# Patient Record
Sex: Female | Born: 1960 | Race: White | Hispanic: No | Marital: Married | Smoking: Current every day smoker
Health system: Southern US, Community
[De-identification: ages and names within clinical notes are randomized; demographics above are authoritative.]

## PROBLEM LIST (undated history)

## (undated) HISTORY — PX: BACK SURGERY: SHX140

## (undated) HISTORY — PX: NECK SURGERY: SHX720

## (undated) HISTORY — PX: CARDIAC CATHETERIZATION: SHX172

## (undated) HISTORY — PX: TUBAL LIGATION: SHX77

---

## 2003-09-02 ENCOUNTER — Ambulatory Visit (HOSPITAL_COMMUNITY): Admission: RE | Admit: 2003-09-02 | Discharge: 2003-09-02 | Payer: Self-pay | Admitting: Neurosurgery

## 2003-09-10 ENCOUNTER — Ambulatory Visit (HOSPITAL_COMMUNITY): Admission: RE | Admit: 2003-09-10 | Discharge: 2003-09-11 | Payer: Self-pay | Admitting: Neurosurgery

## 2005-01-06 ENCOUNTER — Other Ambulatory Visit: Payer: Self-pay

## 2005-01-06 ENCOUNTER — Inpatient Hospital Stay: Payer: Self-pay | Admitting: Internal Medicine

## 2005-01-07 ENCOUNTER — Other Ambulatory Visit: Payer: Self-pay

## 2005-01-08 ENCOUNTER — Other Ambulatory Visit: Payer: Self-pay

## 2010-09-28 DIAGNOSIS — I2510 Atherosclerotic heart disease of native coronary artery without angina pectoris: Secondary | ICD-10-CM | POA: Insufficient documentation

## 2010-09-28 DIAGNOSIS — E785 Hyperlipidemia, unspecified: Secondary | ICD-10-CM | POA: Insufficient documentation

## 2013-02-27 ENCOUNTER — Emergency Department: Payer: Self-pay | Admitting: Emergency Medicine

## 2013-02-27 LAB — RAPID INFLUENZA A&B ANTIGENS

## 2013-04-22 DIAGNOSIS — IMO0001 Reserved for inherently not codable concepts without codable children: Secondary | ICD-10-CM | POA: Insufficient documentation

## 2013-04-22 DIAGNOSIS — F17200 Nicotine dependence, unspecified, uncomplicated: Secondary | ICD-10-CM | POA: Insufficient documentation

## 2014-10-04 DIAGNOSIS — M4302 Spondylolysis, cervical region: Secondary | ICD-10-CM | POA: Insufficient documentation

## 2014-10-12 DIAGNOSIS — M5412 Radiculopathy, cervical region: Secondary | ICD-10-CM | POA: Insufficient documentation

## 2018-09-20 ENCOUNTER — Emergency Department
Admission: EM | Admit: 2018-09-20 | Discharge: 2018-09-20 | Disposition: A | Discharge status: Home / Self Care | Payer: 59 | Attending: Emergency Medicine | Admitting: Emergency Medicine

## 2018-09-20 ENCOUNTER — Other Ambulatory Visit: Payer: Self-pay

## 2018-09-20 ENCOUNTER — Encounter: Payer: Self-pay | Admitting: Emergency Medicine

## 2018-09-20 ENCOUNTER — Emergency Department: Payer: 59

## 2018-09-20 DIAGNOSIS — I2510 Atherosclerotic heart disease of native coronary artery without angina pectoris: Secondary | ICD-10-CM | POA: Insufficient documentation

## 2018-09-20 DIAGNOSIS — G8929 Other chronic pain: Secondary | ICD-10-CM | POA: Insufficient documentation

## 2018-09-20 DIAGNOSIS — E785 Hyperlipidemia, unspecified: Secondary | ICD-10-CM | POA: Insufficient documentation

## 2018-09-20 DIAGNOSIS — R519 Headache, unspecified: Secondary | ICD-10-CM

## 2018-09-20 DIAGNOSIS — M542 Cervicalgia: Secondary | ICD-10-CM | POA: Insufficient documentation

## 2018-09-20 DIAGNOSIS — R51 Headache: Secondary | ICD-10-CM | POA: Insufficient documentation

## 2018-09-20 DIAGNOSIS — F17210 Nicotine dependence, cigarettes, uncomplicated: Secondary | ICD-10-CM | POA: Insufficient documentation

## 2018-09-20 DIAGNOSIS — R4182 Altered mental status, unspecified: Secondary | ICD-10-CM | POA: Diagnosis not present

## 2018-09-20 LAB — COMPREHENSIVE METABOLIC PANEL
ALT: 17 U/L (ref 0–44)
AST: 43 U/L — ABNORMAL HIGH (ref 15–41)
Albumin: 3.9 g/dL (ref 3.5–5.0)
Alkaline Phosphatase: 62 U/L (ref 38–126)
Anion gap: 11 (ref 5–15)
BUN: 9 mg/dL (ref 6–20)
CO2: 22 mmol/L (ref 22–32)
Calcium: 8.4 mg/dL — ABNORMAL LOW (ref 8.9–10.3)
Chloride: 106 mmol/L (ref 98–111)
Creatinine, Ser: 0.77 mg/dL (ref 0.44–1.00)
GFR calc Af Amer: >60 mL/min (ref >60)
GFR calc non Af Amer: >60 mL/min (ref >60)
Glucose, Bld: 95 mg/dL (ref 70–99)
Potassium: 3.4 mmol/L — ABNORMAL LOW (ref 3.5–5.1)
Sodium: 139 mmol/L (ref 135–145)
Total Bilirubin: 0.7 mg/dL (ref 0.3–1.2)
Total Protein: 6.8 g/dL (ref 6.5–8.1)

## 2018-09-20 LAB — URINALYSIS, ROUTINE W REFLEX MICROSCOPIC
Bilirubin Urine: NEGATIVE
Glucose, UA: NEGATIVE mg/dL
Ketones, ur: 20 mg/dL — AB
Leukocytes,Ua: NEGATIVE
Nitrite: NEGATIVE
Protein, ur: NEGATIVE mg/dL
Specific Gravity, Urine: 1.003 — ABNORMAL LOW (ref 1.005–1.030)
Squamous Epithelial / LPF: NONE SEEN (ref 0–5)
pH: 5 (ref 5.0–8.0)

## 2018-09-20 LAB — CBC
HCT: 40.1 % (ref 36.0–46.0)
Hemoglobin: 13.3 g/dL (ref 12.0–15.0)
MCH: 32.2 pg (ref 26.0–34.0)
MCHC: 33.2 g/dL (ref 30.0–36.0)
MCV: 97.1 fL (ref 80.0–100.0)
Platelets: 229 10*3/uL (ref 150–400)
RBC: 4.13 MIL/uL (ref 3.87–5.11)
RDW: 15.3 % (ref 11.5–15.5)
WBC: 5.9 10*3/uL (ref 4.0–10.5)
nRBC: 1.2 % — ABNORMAL HIGH (ref 0.0–0.2)

## 2018-09-20 LAB — GLUCOSE, CAPILLARY: Glucose-Capillary: 96 mg/dL (ref 70–99)

## 2018-09-20 NOTE — ED Provider Notes (Signed)
[facility] Emergency Department Provider Note  ____________________________________________   First MD Initiated Contact with Patient 09/20/18 2015     (approximate)  I have reviewed the triage vital signs and the nursing notes.   HISTORY  Chief Complaint Headache and Altered Mental Status    HPI Annette Huff is a 58 y.o. female who presents with concerns for memory issues and twitching in her hands.  Patient has had symptoms since Tuesday.  She said her symptoms started off as feeling like it was hard to hear out of her bilateral ears like her head was in a bucket.  She describes some mild frontal headache.  She takes [facility] powder with some relief.  Nothing seems to make it worse.  She denies it being a sudden onset severe headache.  Denies any changes in her vision.  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  She also endorsed having some shaking in her bilateral hands however that has mostly resolved now.  Denies any fevers or neck stiffness.       Medical: Hyperlipidemia, coronary disease.  Patient Active Problem List   Diagnosis Date Noted  . Chronic neck pain 09/20/2018  . Cervical radiculopathy 10/12/2014  . 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 Smoking 04/22/2013  . Coronary artery disease involving native coronary artery of native heart without angina pectoris 09/28/2010  . Hyperlipidemia 09/28/2010      Prior to Admission medications   Not on File    Allergies Guaifenesin and Sulfa antibiotics  No family history on file.  Social History Social History   Tobacco Use  . Smoking status: Current Every Day Smoker    Packs/day: 1.00    Types: Cigarettes  . Smokeless tobacco: Never Used  Substance Use Topics  . Alcohol use: Not on file  . Drug use: Not on file      Review of Systems Constitutional: No fever/chills Eyes: No  visual changes. ENT: No sore throat.  Positive for decreased hearing bilaterally Cardiovascular: Denies chest pain. Respiratory: Denies shortness of breath. Gastrointestinal: No abdominal pain.  No nausea, no vomiting.  No diarrhea.  No constipation. Genitourinary: Negative for dysuria. Musculoskeletal: Negative for back pain.  Positive for twitching in her bilateral hands Skin: Negative for rash. Neurological: Positive for headaches All other ROS negative ____________________________________________   PHYSICAL EXAM:  VITAL SIGNS: ED Triage Vitals  Enc Vitals Group     BP 09/20/18 1831 116/61     Pulse Rate 09/20/18 1831 85     Resp 09/20/18 1831 18     Temp 09/20/18 1831 98.8 F (37.1 C)     Temp Source 09/20/18 1831 Oral     SpO2 09/20/18 1831 96 %     Weight 09/20/18 1826 155 lb (70.3 kg)     Height 09/20/18 1826 5\' 5"  (1.651 m)     Head Circumference --      Peak Flow --      Pain Score 09/20/18 1826 3     Pain Loc --      Pain Edu? --      Excl. in [place]? --     Constitutional: Alert and oriented. Well appearing and in no acute distress. Eyes: Conjunctivae are normal. EOMI. Head: Atraumatic.TM clear bilaterally Nose: No congestion/rhinnorhea.   Mouth/Throat: Mucous membranes are moist.   Neck: No stridor. Trachea Midline. FROM Cardiovascular: Normal rate,  regular rhythm. Grossly normal heart sounds.  Good peripheral circulation. Respiratory: Normal respiratory effort.  No retractions. Lungs CTAB. Gastrointestinal: Soft and nontender. No distention. No abdominal bruits.  Musculoskeletal: No lower extremity tenderness nor edema.  No joint effusions.  No twitching in her hands noted. Neurologic:  Normal speech and language.  Alert and oriented x3.  Cranial nerves II through XII intact.  No pronator drift.  Able to ambulate. Skin:  Skin is warm, dry and intact. No rash noted. Psychiatric: Mood and affect are normal. Speech and behavior are normal. GU: Deferred    ____________________________________________   LABS (all labs ordered are listed, but only abnormal results are displayed)  Labs Reviewed  COMPREHENSIVE METABOLIC PANEL - Abnormal; Notable for the following components:      Result Value   Potassium 3.4 (*)    Calcium 8.4 (*)    AST 43 (*)    All other components within normal limits  CBC - Abnormal; Notable for the following components:   nRBC 1.2 (*)    All other components within normal limits  URINALYSIS, ROUTINE W REFLEX MICROSCOPIC - Abnormal; Notable for the following components:   Color, Urine STRAW (*)    APPearance CLEAR (*)    Specific Gravity, Urine 1.003 (*)    Hgb urine dipstick SMALL (*)    Ketones, ur 20 (*)    Bacteria, UA RARE (*)    All other components within normal limits  GLUCOSE, CAPILLARY  CBG MONITORING, ED   ____________________________________________   ED ECG REPORT =______________________________  RADIOLOGY   Official radiology report(s): Ct Head Wo Contrast  Result Date: 09/20/2018 CLINICAL DATA:  Headache and decreased hearing. Memory loss over the past few days. EXAM: CT HEAD WITHOUT CONTRAST TECHNIQUE: Contiguous axial images were obtained from the base of the skull through the vertex without intravenous contrast. COMPARISON:  None. FINDINGS: Brain: No evidence of acute infarction, hemorrhage, hydrocephalus, extra-axial collection or mass lesion/mass effect. Vascular: No hyperdense vessel or unexpected calcification. Skull: Normal. Negative for fracture or focal lesion. Sinuses/Orbits: No acute finding. Other: None. IMPRESSION: Normal head CT. Electronically Signed   By: Elberta Fortisaniel  Boyle M.D.   On: 09/20/2018 21:45    ____________________________________________   PROCEDURES  Procedure(s) performed (including Critical Care):  Procedures   ____________________________________________   INITIAL IMPRESSION / ASSESSMENT AND PLAN / ED COURSE  [name] was evaluated in Emergency  Department on 09/20/2018 for the symptoms described in the history of present illness. She was evaluated in the context of the global COVID-19 pandemic, which necessitated consideration that the patient might be at risk for infection with the SARS-CoV-2 virus that causes COVID-19. Institutional protocols and algorithms that pertain to the evaluation of patients at risk for COVID-19 are in a state of rapid change based on information released by regulatory bodies including the CDC and federal and state organizations. These policies and algorithms were followed during the patient's care in the ED.    Patient is well-appearing with a normal neuro exam.  Will get CT head to evaluate for intracranial mass.  Low suspicion for stroke given her normal cranial nerves exam.  TMs are clear therefore low suspicion for ear infections.  Low suspicion for meningitis given patient is afebrile with supple neck.  Patient does not have any twitching in her hands at this time. Not the worst headache of life to suggest SAH. No vision changes to suggest venous thrombus.   Pt declined medications for her headache.    Glucose  is normal. Labs notable for a K of 3.4 White count normal. Ct head negative.  10:17 PM reevaluated patient continues to look well.  Patient was discharged.  Patient struck to follow-up with her primary care doctor.  Symptoms are not resolving patient may need MRI for further work-up.  Again low suspicion for stroke at this time.  I discussed the provisional nature of ED diagnosis, the treatment so far, the ongoing plan of care, follow up appointments and return precautions with the patient and any family or support people present. They expressed understanding and agreed with the plan, discharged home.      Blood pressure 112/64, pulse 63, temperature 98.8 F (37.1 C), temperature source Oral, resp. rate 18, height 5\' 5"  (1.651 m), weight 70.3 kg, SpO2 97 %.       ____________________________________________   FINAL CLINICAL IMPRESSION(S) / ED DIAGNOSES   Final diagnoses:  Intractable headache, unspecified chronicity pattern, unspecified headache type      MEDICATIONS GIVEN DURING THIS VISIT:  Medications - No data to display   ED Discharge Orders    None       Note:  This document was prepared using Dragon voice recognition software and may include unintentional dictation errors.   Concha SeFunke, Brentin Shin E, MD 09/20/18 2219

## 2018-09-20 NOTE — ED Notes (Signed)
First Nurse Note: Pt to ED via POV c/o memory issues and "twitching" in her hands. Pt states that she has had symptoms since Tuesday. Pt is in NAD.

## 2018-09-20 NOTE — ED Notes (Signed)
Patient given TV remote 

## 2018-09-20 NOTE — Discharge Instructions (Addendum)
Your CT scan was negative and your labs were reassuring.  You should follow-up with your primary care doctor as needed.  Return to the ER for any other concerns.  To note your AST is slightly elevated at 43 this is a liver enzyme.  This is nothing to do with your headaches.  F/u with PCP.

## 2018-09-20 NOTE — ED Triage Notes (Signed)
Pt arrived via POV with reports of "not feeling good" Pt reports headache and decreased hearing. Pt reports frontal headache and states she woke up Tuesday and heard a humming noise in her ears.  Pt states she also has had some memory loss over the past few days as well. States she couldn't remember how to do some simple tasks at work that she normally does.

## 2018-10-01 ENCOUNTER — Other Ambulatory Visit
Admission: RE | Admit: 2018-10-01 | Discharge: 2018-10-01 | Disposition: A | Discharge status: Home / Self Care | Payer: 59 | Source: Ambulatory Visit | Attending: Internal Medicine | Admitting: Internal Medicine

## 2018-10-01 DIAGNOSIS — J449 Chronic obstructive pulmonary disease, unspecified: Secondary | ICD-10-CM | POA: Insufficient documentation

## 2018-10-01 DIAGNOSIS — E118 Type 2 diabetes mellitus with unspecified complications: Secondary | ICD-10-CM | POA: Diagnosis present

## 2018-10-01 DIAGNOSIS — I2510 Atherosclerotic heart disease of native coronary artery without angina pectoris: Secondary | ICD-10-CM | POA: Diagnosis present

## 2018-10-01 LAB — AMMONIA: Ammonia: 26 umol/L (ref 9–35)

## 2018-10-16 ENCOUNTER — Other Ambulatory Visit: Payer: Self-pay | Admitting: Internal Medicine

## 2018-10-16 DIAGNOSIS — R270 Ataxia, unspecified: Secondary | ICD-10-CM

## 2018-10-16 DIAGNOSIS — R410 Disorientation, unspecified: Secondary | ICD-10-CM

## 2018-10-16 DIAGNOSIS — G8929 Other chronic pain: Secondary | ICD-10-CM

## 2018-10-26 ENCOUNTER — Other Ambulatory Visit: Payer: Self-pay

## 2018-10-26 ENCOUNTER — Ambulatory Visit
Admission: RE | Admit: 2018-10-26 | Discharge: 2018-10-26 | Disposition: A | Discharge status: Home / Self Care | Payer: 59 | Source: Ambulatory Visit | Attending: Internal Medicine | Admitting: Internal Medicine

## 2018-10-26 DIAGNOSIS — G8929 Other chronic pain: Secondary | ICD-10-CM

## 2018-10-26 DIAGNOSIS — R270 Ataxia, unspecified: Secondary | ICD-10-CM | POA: Insufficient documentation

## 2018-10-26 DIAGNOSIS — R410 Disorientation, unspecified: Secondary | ICD-10-CM | POA: Insufficient documentation

## 2018-10-26 DIAGNOSIS — R51 Headache: Secondary | ICD-10-CM | POA: Diagnosis not present

## 2018-10-26 MED ORDER — GADOBUTROL 1 MMOL/ML IV SOLN
7.0000 mL | Freq: Once | INTRAVENOUS | Status: AC | PRN
  Administered 2018-10-26: 7 mL via INTRAVENOUS

## 2019-08-11 DIAGNOSIS — G3184 Mild cognitive impairment, so stated: Secondary | ICD-10-CM | POA: Diagnosis present

## 2019-09-15 ENCOUNTER — Inpatient Hospital Stay
Admission: EM | Admit: 2019-09-15 | Discharge: 2019-09-20 | DRG: 481 | Disposition: A | Discharge status: Home Health | Payer: 59 | Attending: Internal Medicine | Admitting: Internal Medicine

## 2019-09-15 ENCOUNTER — Emergency Department: Payer: 59

## 2019-09-15 ENCOUNTER — Other Ambulatory Visit: Payer: Self-pay

## 2019-09-15 DIAGNOSIS — E785 Hyperlipidemia, unspecified: Secondary | ICD-10-CM | POA: Diagnosis present

## 2019-09-15 DIAGNOSIS — R0902 Hypoxemia: Secondary | ICD-10-CM | POA: Diagnosis not present

## 2019-09-15 DIAGNOSIS — Z882 Allergy status to sulfonamides status: Secondary | ICD-10-CM

## 2019-09-15 DIAGNOSIS — I959 Hypotension, unspecified: Secondary | ICD-10-CM | POA: Diagnosis not present

## 2019-09-15 DIAGNOSIS — F17210 Nicotine dependence, cigarettes, uncomplicated: Secondary | ICD-10-CM | POA: Diagnosis present

## 2019-09-15 DIAGNOSIS — E118 Type 2 diabetes mellitus with unspecified complications: Secondary | ICD-10-CM | POA: Diagnosis not present

## 2019-09-15 DIAGNOSIS — F329 Major depressive disorder, single episode, unspecified: Secondary | ICD-10-CM | POA: Diagnosis present

## 2019-09-15 DIAGNOSIS — S32020A Wedge compression fracture of second lumbar vertebra, initial encounter for closed fracture: Secondary | ICD-10-CM

## 2019-09-15 DIAGNOSIS — G3184 Mild cognitive impairment, so stated: Secondary | ICD-10-CM | POA: Diagnosis present

## 2019-09-15 DIAGNOSIS — Z01818 Encounter for other preprocedural examination: Secondary | ICD-10-CM

## 2019-09-15 DIAGNOSIS — Z79899 Other long term (current) drug therapy: Secondary | ICD-10-CM | POA: Diagnosis not present

## 2019-09-15 DIAGNOSIS — E871 Hypo-osmolality and hyponatremia: Secondary | ICD-10-CM | POA: Diagnosis present

## 2019-09-15 DIAGNOSIS — J9621 Acute and chronic respiratory failure with hypoxia: Secondary | ICD-10-CM | POA: Diagnosis not present

## 2019-09-15 DIAGNOSIS — W1830XA Fall on same level, unspecified, initial encounter: Secondary | ICD-10-CM | POA: Diagnosis present

## 2019-09-15 DIAGNOSIS — Z955 Presence of coronary angioplasty implant and graft: Secondary | ICD-10-CM

## 2019-09-15 DIAGNOSIS — Y92019 Unspecified place in single-family (private) house as the place of occurrence of the external cause: Secondary | ICD-10-CM | POA: Diagnosis not present

## 2019-09-15 DIAGNOSIS — S72009A Fracture of unspecified part of neck of unspecified femur, initial encounter for closed fracture: Secondary | ICD-10-CM

## 2019-09-15 DIAGNOSIS — E119 Type 2 diabetes mellitus without complications: Secondary | ICD-10-CM | POA: Diagnosis present

## 2019-09-15 DIAGNOSIS — Z8673 Personal history of transient ischemic attack (TIA), and cerebral infarction without residual deficits: Secondary | ICD-10-CM

## 2019-09-15 DIAGNOSIS — Z20822 Contact with and (suspected) exposure to covid-19: Secondary | ICD-10-CM | POA: Diagnosis present

## 2019-09-15 DIAGNOSIS — S72142A Displaced intertrochanteric fracture of left femur, initial encounter for closed fracture: Secondary | ICD-10-CM | POA: Diagnosis present

## 2019-09-15 DIAGNOSIS — M25552 Pain in left hip: Secondary | ICD-10-CM | POA: Diagnosis present

## 2019-09-15 DIAGNOSIS — R5082 Postprocedural fever: Secondary | ICD-10-CM | POA: Diagnosis not present

## 2019-09-15 DIAGNOSIS — Y92009 Unspecified place in unspecified non-institutional (private) residence as the place of occurrence of the external cause: Secondary | ICD-10-CM

## 2019-09-15 DIAGNOSIS — Z888 Allergy status to other drugs, medicaments and biological substances status: Secondary | ICD-10-CM

## 2019-09-15 DIAGNOSIS — Z419 Encounter for procedure for purposes other than remedying health state, unspecified: Secondary | ICD-10-CM

## 2019-09-15 DIAGNOSIS — I10 Essential (primary) hypertension: Secondary | ICD-10-CM | POA: Diagnosis present

## 2019-09-15 DIAGNOSIS — M4856XD Collapsed vertebra, not elsewhere classified, lumbar region, subsequent encounter for fracture with routine healing: Secondary | ICD-10-CM | POA: Diagnosis present

## 2019-09-15 DIAGNOSIS — R4189 Other symptoms and signs involving cognitive functions and awareness: Secondary | ICD-10-CM | POA: Diagnosis not present

## 2019-09-15 DIAGNOSIS — I2510 Atherosclerotic heart disease of native coronary artery without angina pectoris: Secondary | ICD-10-CM | POA: Diagnosis present

## 2019-09-15 DIAGNOSIS — W19XXXA Unspecified fall, initial encounter: Secondary | ICD-10-CM

## 2019-09-15 DIAGNOSIS — S72145A Nondisplaced intertrochanteric fracture of left femur, initial encounter for closed fracture: Principal | ICD-10-CM | POA: Diagnosis present

## 2019-09-15 LAB — COMPREHENSIVE METABOLIC PANEL
ALT: 17 U/L (ref 0–44)
AST: 22 U/L (ref 15–41)
Albumin: 3.5 g/dL (ref 3.5–5.0)
Alkaline Phosphatase: 65 U/L (ref 38–126)
Anion gap: 12 (ref 5–15)
BUN: <5 mg/dL — ABNORMAL LOW (ref 8–23)
CO2: 23 mmol/L (ref 22–32)
Calcium: 8.1 mg/dL — ABNORMAL LOW (ref 8.9–10.3)
Chloride: 91 mmol/L — ABNORMAL LOW (ref 98–111)
Creatinine, Ser: 0.4 mg/dL — ABNORMAL LOW (ref 0.44–1.00)
GFR calc Af Amer: >60 mL/min (ref >60)
GFR calc non Af Amer: >60 mL/min (ref >60)
Glucose, Bld: 135 mg/dL — ABNORMAL HIGH (ref 70–99)
Potassium: 3.6 mmol/L (ref 3.5–5.1)
Sodium: 126 mmol/L — ABNORMAL LOW (ref 135–145)
Total Bilirubin: 0.5 mg/dL (ref 0.3–1.2)
Total Protein: 6.6 g/dL (ref 6.5–8.1)

## 2019-09-15 LAB — CBC WITH DIFFERENTIAL/PLATELET
Abs Immature Granulocytes: 0.05 10*3/uL (ref 0.00–0.07)
Basophils Absolute: 0 10*3/uL (ref 0.0–0.1)
Basophils Relative: 0 %
Eosinophils Absolute: 0 10*3/uL (ref 0.0–0.5)
Eosinophils Relative: 0 %
HCT: 37.1 % (ref 36.0–46.0)
Hemoglobin: 13.3 g/dL (ref 12.0–15.0)
Immature Granulocytes: 1 %
Lymphocytes Relative: 8 %
Lymphs Abs: 0.8 10*3/uL (ref 0.7–4.0)
MCH: 33.1 pg (ref 26.0–34.0)
MCHC: 35.8 g/dL (ref 30.0–36.0)
MCV: 92.3 fL (ref 80.0–100.0)
Monocytes Absolute: 0.7 10*3/uL (ref 0.1–1.0)
Monocytes Relative: 7 %
Neutro Abs: 8.1 10*3/uL — ABNORMAL HIGH (ref 1.7–7.7)
Neutrophils Relative %: 84 %
Platelets: 224 10*3/uL (ref 150–400)
RBC: 4.02 MIL/uL (ref 3.87–5.11)
RDW: 12.1 % (ref 11.5–15.5)
WBC: 9.7 10*3/uL (ref 4.0–10.5)
nRBC: 0 % (ref 0.0–0.2)

## 2019-09-15 LAB — SARS CORONAVIRUS 2 BY RT PCR (HOSPITAL ORDER, PERFORMED IN ~~LOC~~ HOSPITAL LAB): SARS Coronavirus 2: NEGATIVE

## 2019-09-15 LAB — OSMOLALITY: Osmolality: 259 mOsm/kg — ABNORMAL LOW (ref 275–295)

## 2019-09-15 MED ORDER — SENNOSIDES-DOCUSATE SODIUM 8.6-50 MG PO TABS: 1.0000 | ORAL_TABLET | Freq: Every evening | ORAL | Status: DC | PRN

## 2019-09-15 MED ORDER — MORPHINE SULFATE (PF) 2 MG/ML IV SOLN
0.5000 mg | INTRAVENOUS | Status: DC | PRN
  Filled 2019-09-15: qty 1

## 2019-09-15 MED ORDER — SODIUM CHLORIDE 0.9 % IV SOLN: INTRAVENOUS | Status: DC

## 2019-09-15 MED ORDER — INSULIN ASPART 100 UNIT/ML ~~LOC~~ SOLN
0.0000 [IU] | SUBCUTANEOUS | Status: DC
  Administered 2019-09-16: 1 [IU] via SUBCUTANEOUS
  Filled 2019-09-15: qty 1

## 2019-09-15 MED ORDER — HYDROCODONE-ACETAMINOPHEN 5-325 MG PO TABS
1.0000 | ORAL_TABLET | Freq: Four times a day (QID) | ORAL | Status: DC | PRN
  Administered 2019-09-15 – 2019-09-17 (×4): 2 via ORAL
  Administered 2019-09-19 (×2): 1 via ORAL
  Filled 2019-09-15 (×3): qty 2
  Filled 2019-09-15 (×2): qty 1
  Filled 2019-09-15: qty 2

## 2019-09-15 MED ORDER — CEFAZOLIN SODIUM-DEXTROSE 2-4 GM/100ML-% IV SOLN
2.0000 g | INTRAVENOUS | Status: AC
  Administered 2019-09-16: 2 g via INTRAVENOUS
  Filled 2019-09-15: qty 100

## 2019-09-15 MED ORDER — ZOLPIDEM TARTRATE 5 MG PO TABS
5.0000 mg | ORAL_TABLET | Freq: Every evening | ORAL | Status: DC | PRN
  Administered 2019-09-17: 5 mg via ORAL
  Filled 2019-09-15: qty 1

## 2019-09-15 NOTE — ED Provider Notes (Signed)
[facility] Emergency Department Provider Note  ____________________________________________  Time seen: Approximately 6:04 PM  I have reviewed the triage vital signs and the nursing notes.   HISTORY  Chief Complaint Fall    HPI Annette Huff is a 59 y.o. female who presents the emergency department via EMS from home after sustaining a fall last night.  Patient states that she was up past 2 AM watching TV.  At some point she got up, was unsure whether she was walking to bed, to the bathroom but did sustain a fall.  Evidently a family member heard her fall and her husband had to pick her out of the floor and carrier to the couch.  Patient does not remember the fall or events immediately following this injury.  Patient has been unable to stand or bear weight on the left lower extremity.  She has sharp left hip/pelvic pain.  Patient is unsure whether she hit her head, she is unsure whether she lost consciousness.  According to the husband the patient was very confused following the fall.  Patient denies any headache, visual changes, neck pain, chest pain, shortness of breath, abdominal pain.  No urinary or GI complaints.  Primary complaint at this time is left hip pain and inability to walk/bear weight.  No other complaints at this time.        History reviewed. No pertinent past medical history.  Patient Active Problem List   Diagnosis Date Noted   Fall at home, initial encounter 09/15/2019   Preoperative clearance 09/15/2019   Compression fracture of L2 [facility]) - age indeterminate 09/15/2019   Closed fracture of femur, intertrochanteric, left, initial encounter (HCC) 09/15/2019   History of TIA (transient ischemic attack) 09/15/2019   HTN (hypertension) 09/15/2019   Hyponatremia 09/15/2019   MCI (mild cognitive impairment) 08/11/2019   Diabetes mellitus type 2 with complications (HCC) 10/01/2018   Chronic neck pain 09/20/2018   Cervical  radiculopathy 10/12/2014   Spondylolysis of cervical region 10/04/2014   Smoking 04/22/2013   Coronary artery disease involving native coronary artery of native heart without angina pectoris 09/28/2010   Hyperlipidemia 09/28/2010    Past Surgical History:  Procedure Laterality Date   TUBAL LIGATION      Prior to Admission medications   Medication Sig Start Date End Date Taking? Authorizing Provider  acetaminophen (TYLENOL) 500 MG tablet Take 500 mg by mouth every 6 (six) hours as needed for mild pain.   Yes [provider]  escitalopram (LEXAPRO) 10 MG tablet Take 10 mg by mouth daily at 6 (six) AM. 07/17/19 10/15/19 Yes [provider]    Allergies Guaifenesin and Sulfa antibiotics  History reviewed. No pertinent family history.  Social History Social History   Tobacco Use   Smoking status: Current Every Day Smoker    Packs/day: 1.00    Types: Cigarettes   Smokeless tobacco: Never Used  Vaping Use   Vaping Use: Never used  Substance Use Topics   Alcohol use: Not on file   Drug use: Not on file     Review of Systems  Constitutional: No fever/chills Eyes: No visual changes. No discharge ENT: No upper respiratory complaints. Cardiovascular: no chest pain. Respiratory: no cough. No SOB. Gastrointestinal: No abdominal pain.  No nausea, no vomiting.  No diarrhea.  No constipation. Genitourinary: Negative for dysuria. No hematuria Musculoskeletal: Left hip/pelvis pain with inability to bear weight on the left lower extremity. Skin: Negative for rash, abrasions, lacerations, ecchymosis. Neurological: Negative for  headaches, focal weakness or numbness. 10-point ROS otherwise negative.  ____________________________________________   PHYSICAL EXAM:  VITAL SIGNS: ED Triage Vitals  Enc Vitals Group     BP 09/15/19 1757 125/89     Pulse Rate 09/15/19 1757 80     Resp 09/15/19 1757 16     Temp 09/15/19 1757 97.9 F (36.6 C)     Temp Source  09/15/19 1757 Oral     SpO2 09/15/19 1757 95 %     Weight 09/15/19 1801 133 lb (60.3 kg)     Height 09/15/19 1801 5\' 5"  (1.651 m)     Head Circumference --      Peak Flow --      Pain Score 09/15/19 1800 6     Pain Loc --      Pain Edu? --      Excl. in GC? --      Constitutional: Alert and oriented. Well appearing and in no acute distress. Eyes: Conjunctivae are normal. PERRL. EOMI. Head: Atraumatic.  No abrasions, lacerations, ecchymosis, hematomas.  No tenderness to palpation over the osseous structures of the skull and face.  No palpable abnormality or crepitus.  No battle signs, raccoon eyes, serosanguineous fluid drainage from the ears or nares. ENT:      Ears:       Nose: No congestion/rhinnorhea.      Mouth/Throat: Mucous membranes are moist.  Neck: No stridor.  No cervical spine tenderness to palpation.  Cardiovascular: Normal rate, regular rhythm. Normal S1 and S2.  Good peripheral circulation. Respiratory: Normal respiratory effort without tachypnea or retractions. Lungs CTAB. Good air entry to the bases with no decreased or absent breath sounds. Gastrointestinal: Bowel sounds 4 quadrants. Soft and nontender to palpation. No guarding or rigidity. No palpable masses. No distention.  Musculoskeletal: Full range of motion to all extremities. No gross deformities appreciated.  Visualization of the left lower extremity reveals no abrasions or lacerations.  Patient does have external rotation and some shortening of the left lower extremity when compared with right.  No range of motion of the hip at this time.  Very tender to palpation along the hip, inguinal region.  No palpable abnormalities.  Examination of the knee, lower extremity does not reveal any abnormal findings.  Dorsalis pedis pulse and sensation intact distally. Neurologic:  Normal speech and language. No gross focal neurologic deficits are appreciated.  Skin:  Skin is warm, dry and intact. No rash noted. Psychiatric:  Mood and affect are normal. Speech and behavior are normal. Patient exhibits appropriate insight and judgement.   ____________________________________________   LABS (all labs ordered are listed, but only abnormal results are displayed)  Labs Reviewed  COMPREHENSIVE METABOLIC PANEL - Abnormal; Notable for the following components:      Result Value   Sodium 126 (*)    Chloride 91 (*)    Glucose, Bld 135 (*)    BUN <5 (*)    Creatinine, Ser 0.40 (*)    Calcium 8.1 (*)    All other components within normal limits  CBC WITH DIFFERENTIAL/PLATELET - Abnormal; Notable for the following components:   Neutro Abs 8.1 (*)    All other components within normal limits  OSMOLALITY - Abnormal; Notable for the following components:   Osmolality 259 (*)    All other components within normal limits  SARS CORONAVIRUS 2 BY RT PCR (HOSPITAL ORDER, PERFORMED IN [facility])  SURGICAL PCR SCREEN  URINALYSIS, COMPLETE (UACMP) WITH MICROSCOPIC  HIV  ANTIBODY (ROUTINE TESTING W REFLEX)  CBC  HEMOGLOBIN A1C  OSMOLALITY, URINE  SODIUM, URINE, RANDOM  PROTIME-INR  APTT   ____________________________________________  EKG   ____________________________________________  RADIOLOGY I personally viewed and evaluated these images as part of my medical decision making, as well as reviewing the written report by the radiologist.  DG Chest 1 View  Result Date: 09/15/2019 CLINICAL DATA:  Fall.  Left hip fracture. EXAM: CHEST  1 VIEW COMPARISON:  02/27/2013 FINDINGS: Lower cervical plate and screw fixator. Bony demineralization. Apical lordotic projection. The lungs appear clear. No blunting of the costophrenic angles. Cardiac and mediastinal margins appear normal. IMPRESSION: 1. No active cardiopulmonary disease is radiographically apparent. 2. Bony demineralization. Electronically Signed   By: Gaylyn RongWalter  Liebkemann M.D.   On: 09/15/2019 19:15   DG Lumbar Spine 2-3 Views  Result Date:  09/15/2019 CLINICAL DATA:  59 year old female with fall and back pain. EXAM: LUMBAR SPINE - 2-3 VIEW COMPARISON:  None. FINDINGS: Five lumbar type vertebra. There is mild compression fracture of the superior endplate of L2, age indeterminate. Correlation with clinical exam and point tenderness recommended. No other acute fracture identified. The bones are osteopenic. There is mild multilevel degenerative changes with lower lumbar facet arthropathy. A 3 mm radiopaque focus in the right upper quadrant may represent a renal calculus or stone in the gallbladder versus a vascular calcification or liver granuloma. There is atherosclerotic calcification of the abdominal aorta. The soft tissues are grossly unremarkable. IMPRESSION: Mild compression fracture of the superior endplate of L2, age indeterminate. Correlation with clinical exam and point tenderness recommended. Electronically Signed   By: Elgie CollardArash  Radparvar M.D.   On: 09/15/2019 19:15   CT Head Wo Contrast  Result Date: 09/15/2019 CLINICAL DATA:  Larey SeatFell this morning, possible loss of consciousness, inability to ambulate EXAM: CT HEAD WITHOUT CONTRAST CT CERVICAL SPINE WITHOUT CONTRAST TECHNIQUE: Multidetector CT imaging of the head and cervical spine was performed following the standard protocol without intravenous contrast. Multiplanar CT image reconstructions of the cervical spine were also generated. COMPARISON:  09/20/2018, 09/02/2003 FINDINGS: CT HEAD FINDINGS Brain: No acute infarct or hemorrhage. Lateral ventricles and midline structures are unremarkable. No acute extra-axial fluid collections. No mass effect. Vascular: No hyperdense vessel or unexpected calcification. Skull: Normal. Negative for fracture or focal lesion. Sinuses/Orbits: No acute finding. Other: None. CT CERVICAL SPINE FINDINGS Alignment: Stable straightening of the cervical spine, with mild kyphosis centered at C4. Otherwise alignment is anatomic. Skull base and vertebrae: No acute  displaced fracture. Soft tissues and spinal canal: No prevertebral fluid or swelling. No visible canal hematoma. Disc levels: Previous C6/C7 ACDF. There is prominent spondylosis at C4-5 and C5-6 with symmetrical bilateral neural foraminal narrowing. At C3/C4 there is significant bilateral facet hypertrophy and mild bilateral uncovertebral hypertrophy, resulting in symmetrical bilateral neural foraminal encroachment. Upper chest: Airway is patent. Emphysematous changes are seen at the lung apices. Other: Reconstructed images demonstrate no additional findings. IMPRESSION: 1. No acute intracranial process. 2. No acute cervical spine fracture. 3. Previous C6/C7 ACDF. Prominent spondylosis at C4-5 and C5-6 with symmetrical bilateral neural foraminal narrowing. Electronically Signed   By: Sharlet SalinaMichael  Brown M.D.   On: 09/15/2019 18:57   CT Cervical Spine Wo Contrast  Result Date: 09/15/2019 CLINICAL DATA:  Larey SeatFell this morning, possible loss of consciousness, inability to ambulate EXAM: CT HEAD WITHOUT CONTRAST CT CERVICAL SPINE WITHOUT CONTRAST TECHNIQUE: Multidetector CT imaging of the head and cervical spine was performed following the standard protocol without intravenous contrast. Multiplanar CT image  reconstructions of the cervical spine were also generated. COMPARISON:  09/20/2018, 09/02/2003 FINDINGS: CT HEAD FINDINGS Brain: No acute infarct or hemorrhage. Lateral ventricles and midline structures are unremarkable. No acute extra-axial fluid collections. No mass effect. Vascular: No hyperdense vessel or unexpected calcification. Skull: Normal. Negative for fracture or focal lesion. Sinuses/Orbits: No acute finding. Other: None. CT CERVICAL SPINE FINDINGS Alignment: Stable straightening of the cervical spine, with mild kyphosis centered at C4. Otherwise alignment is anatomic. Skull base and vertebrae: No acute displaced fracture. Soft tissues and spinal canal: No prevertebral fluid or swelling. No visible canal  hematoma. Disc levels: Previous C6/C7 ACDF. There is prominent spondylosis at C4-5 and C5-6 with symmetrical bilateral neural foraminal narrowing. At C3/C4 there is significant bilateral facet hypertrophy and mild bilateral uncovertebral hypertrophy, resulting in symmetrical bilateral neural foraminal encroachment. Upper chest: Airway is patent. Emphysematous changes are seen at the lung apices. Other: Reconstructed images demonstrate no additional findings. IMPRESSION: 1. No acute intracranial process. 2. No acute cervical spine fracture. 3. Previous C6/C7 ACDF. Prominent spondylosis at C4-5 and C5-6 with symmetrical bilateral neural foraminal narrowing. Electronically Signed   By: Sharlet Salina M.D.   On: 09/15/2019 18:57   DG Hip Unilat W or Wo Pelvis 2-3 Views Left  Result Date: 09/15/2019 CLINICAL DATA:  Fall, left hip pain. EXAM: DG HIP (WITH OR WITHOUT PELVIS) 2-3V LEFT COMPARISON:  None. FINDINGS: Acute intertrochanteric fracture of the left hip noted (extra-articular). There is clear bony discontinuity extending through the lesser and greater trochanter along with a lucency extending between these discontinuities. Preserved articular spaces in the hips. Bony demineralization. No additional pelvic fractures. IMPRESSION: 1. Acute intertrochanteric fracture of the left hip. 2. Bony demineralization. Electronically Signed   By: Gaylyn Rong M.D.   On: 09/15/2019 19:16    ____________________________________________    PROCEDURES  Procedure(s) performed:    Procedures    Medications  HYDROcodone-acetaminophen (NORCO/VICODIN) 5-325 MG per tablet 1-2 tablet (2 tablets Oral Given 09/15/19 2237)  morphine 2 MG/ML injection 0.5 mg (has no administration in time range)  0.9 %  sodium chloride infusion ( Intravenous New Bag/Given 09/15/19 2118)  zolpidem (AMBIEN) tablet 5 mg (has no administration in time range)  senna-docusate (Senokot-S) tablet 1 tablet (has no administration in time  range)  insulin aspart (novoLOG) injection 0-9 Units (0 Units Subcutaneous Not Given 09/15/19 2119)  ceFAZolin (ANCEF) IVPB 2g/100 mL premix (has no administration in time range)     ____________________________________________   INITIAL IMPRESSION / ASSESSMENT AND PLAN / ED COURSE  Pertinent labs & imaging results that were available during my care of the patient were reviewed by me and considered in my medical decision making (see chart for details).  Review of the [place] CSRS was performed in accordance of the [facility] prior to dispensing any controlled drugs.           Patient's diagnosis is consistent with fall, intertrochanteric fracture of the left hip.  Patient presented to emergency department with pain to the left hip, inability to ambulate.  Patient arrived via EMS from home with shortening and external rotation of the left lower extremity.  Patient had sustained a fall last night.  He does not remember the injury and according to the family member, patient was confused following the fall.  As such I image the head and neck even though patient had no headache or neck pain complaints.  These are reassuring with no acute findings.  Labs are reassuring at this time.  Imaging  does reveal an incidental finding of compression fracture at L2.  Patient is nontender at this area and it is indeterminate whether this occurred at the same time as her hip fracture.  This patient is neurologically intact, nontender at this area, no further investigation of this finding.  Patient does have an intertrochanteric fracture of the left hip.  I contacted orthopedics who advised that we should admit the patient to the hospital service and that they will operate tomorrow.  Hospitalist services contacted for admission and accept the patient at this time.  Patient care will be transferred to the hospitalist and orthopedic services at this time..     ____________________________________________  FINAL CLINICAL  IMPRESSION(S) / ED DIAGNOSES  Final diagnoses:  Fall, initial encounter  Closed nondisplaced intertrochanteric fracture of left femur, initial encounter (HCC)      NEW MEDICATIONS STARTED DURING THIS VISIT:  ED Discharge Orders    None          This chart was dictated using voice recognition software/Dragon. Despite best efforts to proofread, errors can occur which can change the meaning. Any change was purely unintentional.    Racheal Patches, PA-C 09/16/19 [id number]    Phineas Semen, MD 09/16/19 [phone]

## 2019-09-15 NOTE — ED Triage Notes (Signed)
Pt to ED from home c/o fall. Per EMS patient fell at approximately 2 AM this morning and husband helped her back to bed. This afternoon she was unable to move and bear weight on left leg and called for EMS. EMS administered Fentanyl 100 mcg and Zofran 4mg . Rotation and shortening of left leg noted.

## 2019-09-15 NOTE — H&P (Signed)
History and Physical    Annette PoliceLisa G Aken WUJ:[id number]:[id number] DOB: 02/03/1961 DOA: 09/15/2019  PCP: Lauro RegulusAnderson, Marshall W, MD   Patient coming from: home  I have personally briefly reviewed patient's old medical records [facility]  Chief Complaint: Fall, unable to bear weight on left hip  HPI: Annette Huff is a 59 y.o. 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  Patient got up from watching TV and fell.  Her husband helped her to bed however she stayed in bed all day and when she attempted to move she could not bear weight on the left hip.  Patient cannot recall the circumstances surrounding the fall though she does remember her husband helping her out.  She cannot recall having preceding symptoms such as lightheadedness, visual disturbance, one-sided numbness or weakness, chest pain, palpitations or shortness of breath.  She has not been recently ill.  Following the fall she did appear more confused.  ED Course: On arrival, patient alert and oriented.  Vitals within normal limits.  Imaging work-up revealed intertrochanteric fracture of the left hip.  CT head negative.  Finding of age-indeterminate L2 compression fracture.  Blood work significant for hyponatremia of 126 but otherwise within normal limits.  ER provider spoke with orthopedist, Dr. Martha ClanKrasinski who will take patient to the OR in the a.m.  Hospitalist consulted for admission.  Review of Systems: As per HPI otherwise all other systems on review of systems negative.    History reviewed. No pertinent past medical history.  Past Surgical History:  Procedure Laterality Date  . TUBAL LIGATION       reports that she has been smoking cigarettes. She has been smoking about 1.00 pack per day. She has never used smokeless tobacco. No  history on file for alcohol use and drug use.  Allergies  Allergen Reactions  . Guaifenesin Other (See Comments)  . Sulfa Antibiotics Other (See Comments)    History reviewed. No pertinent family history.    Prior to Admission medications   Not on File    Physical Exam: Vitals:   09/15/19 1757 09/15/19 1801  BP: 125/89   Pulse: 80   Resp: 16   Temp: 97.9 F (36.6 C)   TempSrc: Oral   SpO2: 95%   Weight:  60.3 kg  Height:  5\' 5"  (1.651 m)     Vitals:   09/15/19 1757 09/15/19 1801  BP: 125/89   Pulse: 80   Resp: 16   Temp: 97.9 F (36.6 C)   TempSrc: Oral   SpO2: 95%   Weight:  60.3 kg  Height:  5\' 5"  (1.651 m)      Constitutional: Alert and oriented x 3 . Not in any apparent distress HEENT:      Head: Normocephalic and atraumatic.         Eyes: PERLA, EOMI, Conjunctivae are normal. Sclera is non-icteric.       Mouth/Throat: Mucous membranes are moist.       Neck: Supple with no signs of meningismus. Cardiovascular: Regular rate and rhythm. No murmurs, gallops, or rubs. 2+ symmetrical distal pulses are present . No JVD. No LE edema Respiratory: Respiratory effort normal .Lungs sounds clear bilaterally. No wheezes, crackles, or rhonchi.  Gastrointestinal: Soft, non tender, and non distended with positive bowel sounds. No  rebound or guarding. Genitourinary: No CVA tenderness. Musculoskeletal: Left leg shortened and then flexion abduction and external rotation Neurologic: Normal speech and language. Face is symmetric. Moving all extremities. No gross focal neurologic deficits . Skin: Skin is warm, dry.  No rash or ulcers Psychiatric: Mood and affect are normal Speech and behavior are normal   Labs on Admission: I have personally reviewed following labs and imaging studies  CBC: Recent Labs  Lab 09/15/19 1935  WBC 9.7  NEUTROABS 8.1*  HGB 13.3  HCT 37.1  MCV 92.3  PLT 224   Basic Metabolic Panel: No results for input(s): NA, K, CL, CO2, GLUCOSE,  BUN, CREATININE, CALCIUM, MG, PHOS in the last 168 hours. GFR: CrCl cannot be calculated (Patient's most recent lab result is older than the maximum 21 days allowed.). Liver Function Tests: No results for input(s): AST, ALT, ALKPHOS, BILITOT, PROT, ALBUMIN in the last 168 hours. No results for input(s): LIPASE, AMYLASE in the last 168 hours. No results for input(s): AMMONIA in the last 168 hours. Coagulation Profile: No results for input(s): INR, PROTIME in the last 168 hours. Cardiac Enzymes: No results for input(s): CKTOTAL, CKMB, CKMBINDEX, TROPONINI in the last 168 hours. BNP (last 3 results) No results for input(s): PROBNP in the last 8760 hours. HbA1C: No results for input(s): HGBA1C in the last 72 hours. CBG: No results for input(s): GLUCAP in the last 168 hours. Lipid Profile: No results for input(s): CHOL, HDL, LDLCALC, TRIG, CHOLHDL, LDLDIRECT in the last 72 hours. Thyroid Function Tests: No results for input(s): TSH, T4TOTAL, FREET4, T3FREE, THYROIDAB in the last 72 hours. Anemia Panel: No results for input(s): VITAMINB12, FOLATE, FERRITIN, TIBC, IRON, RETICCTPCT in the last 72 hours. Urine analysis:    Component Value Date/Time   COLORURINE STRAW (A) 09/20/2018 1845   APPEARANCEUR CLEAR (A) 09/20/2018 1845   LABSPEC 1.003 (L) 09/20/2018 1845   PHURINE 5.0 09/20/2018 1845   GLUCOSEU NEGATIVE 09/20/2018 1845   HGBUR SMALL (A) 09/20/2018 1845   BILIRUBINUR NEGATIVE 09/20/2018 1845   KETONESUR 20 (A) 09/20/2018 1845   PROTEINUR NEGATIVE 09/20/2018 1845   NITRITE NEGATIVE 09/20/2018 1845   LEUKOCYTESUR NEGATIVE 09/20/2018 1845    Radiological Exams on Admission: DG Chest 1 View  Result Date: 09/15/2019 CLINICAL DATA:  Fall.  Left hip fracture. EXAM: CHEST  1 VIEW COMPARISON:  02/27/2013 FINDINGS: Lower cervical plate and screw fixator. Bony demineralization. Apical lordotic projection. The lungs appear clear. No blunting of the costophrenic angles. Cardiac and  mediastinal margins appear normal. IMPRESSION: 1. No active cardiopulmonary disease is radiographically apparent. 2. Bony demineralization. Electronically Signed   By: Gaylyn Rong M.D.   On: 09/15/2019 19:15   DG Lumbar Spine 2-3 Views  Result Date: 09/15/2019 CLINICAL DATA:  59 year old female with fall and back pain. EXAM: LUMBAR SPINE - 2-3 VIEW COMPARISON:  None. FINDINGS: Five lumbar type vertebra. There is mild compression fracture of the superior endplate of L2, age indeterminate. Correlation with clinical exam and point tenderness recommended. No other acute fracture identified. The bones are osteopenic. There is mild multilevel degenerative changes with lower lumbar facet arthropathy. A 3 mm radiopaque focus in the right upper quadrant may represent a renal calculus or stone in the gallbladder versus a vascular calcification or liver granuloma. There is atherosclerotic calcification of the abdominal aorta. The soft tissues are grossly unremarkable. IMPRESSION: Mild compression fracture of the superior endplate of L2, age indeterminate. Correlation with clinical exam and point tenderness recommended. Electronically Signed   By:  Elgie Collard M.D.   On: 09/15/2019 19:15   CT Head Wo Contrast  Result Date: 09/15/2019 CLINICAL DATA:  Larey Seat this morning, possible loss of consciousness, inability to ambulate EXAM: CT HEAD WITHOUT CONTRAST CT CERVICAL SPINE WITHOUT CONTRAST TECHNIQUE: Multidetector CT imaging of the head and cervical spine was performed following the standard protocol without intravenous contrast. Multiplanar CT image reconstructions of the cervical spine were also generated. COMPARISON:  09/20/2018, 09/02/2003 FINDINGS: CT HEAD FINDINGS Brain: No acute infarct or hemorrhage. Lateral ventricles and midline structures are unremarkable. No acute extra-axial fluid collections. No mass effect. Vascular: No hyperdense vessel or unexpected calcification. Skull: Normal. Negative for  fracture or focal lesion. Sinuses/Orbits: No acute finding. Other: None. CT CERVICAL SPINE FINDINGS Alignment: Stable straightening of the cervical spine, with mild kyphosis centered at C4. Otherwise alignment is anatomic. Skull base and vertebrae: No acute displaced fracture. Soft tissues and spinal canal: No prevertebral fluid or swelling. No visible canal hematoma. Disc levels: Previous C6/C7 ACDF. There is prominent spondylosis at C4-5 and C5-6 with symmetrical bilateral neural foraminal narrowing. At C3/C4 there is significant bilateral facet hypertrophy and mild bilateral uncovertebral hypertrophy, resulting in symmetrical bilateral neural foraminal encroachment. Upper chest: Airway is patent. Emphysematous changes are seen at the lung apices. Other: Reconstructed images demonstrate no additional findings. IMPRESSION: 1. No acute intracranial process. 2. No acute cervical spine fracture. 3. Previous C6/C7 ACDF. Prominent spondylosis at C4-5 and C5-6 with symmetrical bilateral neural foraminal narrowing. Electronically Signed   By: Sharlet Salina M.D.   On: 09/15/2019 18:57   CT Cervical Spine Wo Contrast  Result Date: 09/15/2019 CLINICAL DATA:  Larey Seat this morning, possible loss of consciousness, inability to ambulate EXAM: CT HEAD WITHOUT CONTRAST CT CERVICAL SPINE WITHOUT CONTRAST TECHNIQUE: Multidetector CT imaging of the head and cervical spine was performed following the standard protocol without intravenous contrast. Multiplanar CT image reconstructions of the cervical spine were also generated. COMPARISON:  09/20/2018, 09/02/2003 FINDINGS: CT HEAD FINDINGS Brain: No acute infarct or hemorrhage. Lateral ventricles and midline structures are unremarkable. No acute extra-axial fluid collections. No mass effect. Vascular: No hyperdense vessel or unexpected calcification. Skull: Normal. Negative for fracture or focal lesion. Sinuses/Orbits: No acute finding. Other: None. CT CERVICAL SPINE FINDINGS  Alignment: Stable straightening of the cervical spine, with mild kyphosis centered at C4. Otherwise alignment is anatomic. Skull base and vertebrae: No acute displaced fracture. Soft tissues and spinal canal: No prevertebral fluid or swelling. No visible canal hematoma. Disc levels: Previous C6/C7 ACDF. There is prominent spondylosis at C4-5 and C5-6 with symmetrical bilateral neural foraminal narrowing. At C3/C4 there is significant bilateral facet hypertrophy and mild bilateral uncovertebral hypertrophy, resulting in symmetrical bilateral neural foraminal encroachment. Upper chest: Airway is patent. Emphysematous changes are seen at the lung apices. Other: Reconstructed images demonstrate no additional findings. IMPRESSION: 1. No acute intracranial process. 2. No acute cervical spine fracture. 3. Previous C6/C7 ACDF. Prominent spondylosis at C4-5 and C5-6 with symmetrical bilateral neural foraminal narrowing. Electronically Signed   By: Sharlet Salina M.D.   On: 09/15/2019 18:57   DG Hip Unilat W or Wo Pelvis 2-3 Views Left  Result Date: 09/15/2019 CLINICAL DATA:  Fall, left hip pain. EXAM: DG HIP (WITH OR WITHOUT PELVIS) 2-3V LEFT COMPARISON:  None. FINDINGS: Acute intertrochanteric fracture of the left hip noted (extra-articular). There is clear bony discontinuity extending through the lesser and greater trochanter along with a lucency extending between these discontinuities. Preserved articular spaces in the hips. Bony demineralization. No  additional pelvic fractures. IMPRESSION: 1. Acute intertrochanteric fracture of the left hip. 2. Bony demineralization. Electronically Signed   By: Gaylyn Rong M.D.   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    Closed fracture of femur, intertrochanteric, left, initial encounter (HCC)   Fall at home, initial encounter   Preoperative clearance -Patient presenting with what appears to be a mechanical fall, though with mild associated confusion with poor recollection of the events, in the setting of known mild cognitive dysfunction -CT head with no acute intracranial findings and patient has no acute focal deficits -Given history of TIA, will get MRI to evaluate for CVA.  Patient did arrive more than 5 hours following the event so outside TPA window (patient does not want to have MRI, even with medication for anxiety and optimizing pain meds) -Continuous cardiac monitoring overnight, carotid Doppler and echo though these studies should not delay surgery -Pending negative work-up and no acute findings on EKG, patient will be cleared for surgery at low risk for perioperative cardiopulmonary events    Hyponatremia -IV hydration with normal saline and monitor serum sodium -Possible hypovolemic as patient states she drinks a lot of water every day, may be SIADH related to Lexapro -Follow urine and serum osmolality and urine sodium  Coronary artery disease involving native coronary artery of native heart without angina pectoris, with history of stent -No complaints of chest pain -States she is not on aspirin or cholesterol medication    Compression fracture of L2 (HCC) - age indeterminate -Unlikely related to fall    Diabetes mellitus type 2 with complications (HCC) -Sliding scale insulin coverage -Patient is on no medication for diabetes.  States her doctor follows her sugar    MCI (mild cognitive impairment) -Follows with neurology    History of TIA (transient ischemic attack) -Continue home meds    HTN (hypertension) -On no medication for hypertension    DVT prophylaxis: SCDs Code Status: full code  Family Communication: Husband at bedside Disposition  Plan: Back to previous home environment Consults called: Orthopedics, Dr. Martha Clan Status: Inpatient    Andris Baumann MD [facility]     09/15/2019, 7:56 PM

## 2019-09-15 NOTE — ED Notes (Signed)
Called report to [place], [place]

## 2019-09-16 ENCOUNTER — Inpatient Hospital Stay: Payer: 59

## 2019-09-16 ENCOUNTER — Encounter
Admission: EM | Disposition: A | Discharge status: Home Health | Payer: Self-pay | Source: Home / Self Care | Attending: Internal Medicine

## 2019-09-16 ENCOUNTER — Encounter: Payer: Self-pay | Admitting: Internal Medicine

## 2019-09-16 ENCOUNTER — Inpatient Hospital Stay: Payer: 59 | Admitting: Anesthesiology

## 2019-09-16 DIAGNOSIS — S32020D Wedge compression fracture of second lumbar vertebra, subsequent encounter for fracture with routine healing: Secondary | ICD-10-CM

## 2019-09-16 HISTORY — PX: INTRAMEDULLARY (IM) NAIL INTERTROCHANTERIC: SHX5875

## 2019-09-16 LAB — URINALYSIS, COMPLETE (UACMP) WITH MICROSCOPIC
Bacteria, UA: NONE SEEN
Bilirubin Urine: NEGATIVE
Glucose, UA: NEGATIVE mg/dL
Ketones, ur: 5 mg/dL — AB
Leukocytes,Ua: NEGATIVE
Nitrite: NEGATIVE
Protein, ur: NEGATIVE mg/dL
Specific Gravity, Urine: 1.005 (ref 1.005–1.030)
Squamous Epithelial / HPF: NONE SEEN (ref 0–5)
pH: 5 (ref 5.0–8.0)

## 2019-09-16 LAB — SURGICAL PCR SCREEN
MRSA, PCR: NEGATIVE
Staphylococcus aureus: NEGATIVE

## 2019-09-16 LAB — HIV ANTIBODY (ROUTINE TESTING W REFLEX): HIV Screen 4th Generation wRfx: NONREACTIVE

## 2019-09-16 LAB — BASIC METABOLIC PANEL
Anion gap: 11 (ref 5–15)
BUN: <5 mg/dL — ABNORMAL LOW (ref 8–23)
CO2: 26 mmol/L (ref 22–32)
Calcium: 8.4 mg/dL — ABNORMAL LOW (ref 8.9–10.3)
Chloride: 99 mmol/L (ref 98–111)
Creatinine, Ser: 0.57 mg/dL (ref 0.44–1.00)
GFR calc Af Amer: >60 mL/min (ref >60)
GFR calc non Af Amer: >60 mL/min (ref >60)
Glucose, Bld: 99 mg/dL (ref 70–99)
Potassium: 3.5 mmol/L (ref 3.5–5.1)
Sodium: 136 mmol/L (ref 135–145)

## 2019-09-16 LAB — CBC
HCT: 38.6 % (ref 36.0–46.0)
Hemoglobin: 14.2 g/dL (ref 12.0–15.0)
MCH: 33.6 pg (ref 26.0–34.0)
MCHC: 36.8 g/dL — ABNORMAL HIGH (ref 30.0–36.0)
MCV: 91.3 fL (ref 80.0–100.0)
Platelets: 243 K/uL (ref 150–400)
RBC: 4.23 MIL/uL (ref 3.87–5.11)
RDW: 12.3 % (ref 11.5–15.5)
WBC: 7.9 K/uL (ref 4.0–10.5)
nRBC: 0 % (ref 0.0–0.2)

## 2019-09-16 LAB — HEMOGLOBIN A1C
Hgb A1c MFr Bld: 6 % — ABNORMAL HIGH (ref 4.8–5.6)
Mean Plasma Glucose: 125.5 mg/dL

## 2019-09-16 LAB — GLUCOSE, CAPILLARY
Glucose-Capillary: 122 mg/dL — ABNORMAL HIGH (ref 70–99)
Glucose-Capillary: 145 mg/dL — ABNORMAL HIGH (ref 70–99)
Glucose-Capillary: 65 mg/dL — ABNORMAL LOW (ref 70–99)
Glucose-Capillary: 79 mg/dL (ref 70–99)
Glucose-Capillary: 92 mg/dL (ref 70–99)
Glucose-Capillary: 96 mg/dL (ref 70–99)
Glucose-Capillary: 99 mg/dL (ref 70–99)

## 2019-09-16 LAB — PROTIME-INR
INR: 0.9 (ref 0.8–1.2)
Prothrombin Time: 12.2 s (ref 11.4–15.2)

## 2019-09-16 LAB — OSMOLALITY, URINE: Osmolality, Ur: 160 mOsm/kg — ABNORMAL LOW (ref 300–900)

## 2019-09-16 LAB — SODIUM: Sodium: 136 mmol/L (ref 135–145)

## 2019-09-16 LAB — APTT: aPTT: 31 seconds (ref 24–36)

## 2019-09-16 LAB — SODIUM, URINE, RANDOM: Sodium, Ur: <10 mmol/L

## 2019-09-16 SURGERY — FIXATION, FRACTURE, INTERTROCHANTERIC, WITH INTRAMEDULLARY ROD
Anesthesia: Choice | Laterality: Left

## 2019-09-16 MED ORDER — SENNA 8.6 MG PO TABS
1.0000 | ORAL_TABLET | Freq: Two times a day (BID) | ORAL | Status: DC
  Administered 2019-09-16 – 2019-09-20 (×8): 8.6 mg via ORAL
  Filled 2019-09-16 (×8): qty 1

## 2019-09-16 MED ORDER — ESCITALOPRAM OXALATE 10 MG PO TABS
10.0000 mg | ORAL_TABLET | Freq: Every day | ORAL | Status: DC
  Administered 2019-09-17 – 2019-09-20 (×4): 10 mg via ORAL
  Filled 2019-09-16 (×4): qty 1

## 2019-09-16 MED ORDER — METHOCARBAMOL 1000 MG/10ML IJ SOLN
500.0000 mg | Freq: Four times a day (QID) | INTRAVENOUS | Status: DC | PRN
  Filled 2019-09-16: qty 5

## 2019-09-16 MED ORDER — FLEET ENEMA 7-19 GM/118ML RE ENEM: 1.0000 | ENEMA | Freq: Once | RECTAL | Status: DC | PRN

## 2019-09-16 MED ORDER — ONDANSETRON HCL 4 MG/2ML IJ SOLN: 4.0000 mg | Freq: Once | INTRAMUSCULAR | Status: DC | PRN

## 2019-09-16 MED ORDER — SODIUM CHLORIDE 0.9 % IR SOLN
Status: DC | PRN
  Administered 2019-09-16: 1000 mL

## 2019-09-16 MED ORDER — ONDANSETRON HCL 4 MG/2ML IJ SOLN: 4.0000 mg | Freq: Four times a day (QID) | INTRAMUSCULAR | Status: DC | PRN

## 2019-09-16 MED ORDER — CHLORHEXIDINE GLUCONATE CLOTH 2 % EX PADS
6.0000 | MEDICATED_PAD | Freq: Every day | CUTANEOUS | Status: DC
  Administered 2019-09-16 – 2019-09-19 (×3): 6 via TOPICAL

## 2019-09-16 MED ORDER — FENTANYL CITRATE (PF) 100 MCG/2ML IJ SOLN
INTRAMUSCULAR | Status: DC | PRN
  Administered 2019-09-16: 50 ug via INTRAVENOUS

## 2019-09-16 MED ORDER — PROPOFOL 10 MG/ML IV BOLUS
INTRAVENOUS | Status: AC
  Filled 2019-09-16: qty 20

## 2019-09-16 MED ORDER — METHOCARBAMOL 500 MG PO TABS
500.0000 mg | ORAL_TABLET | Freq: Four times a day (QID) | ORAL | Status: DC | PRN
  Administered 2019-09-20: 500 mg via ORAL
  Filled 2019-09-16: qty 1

## 2019-09-16 MED ORDER — PROPOFOL 500 MG/50ML IV EMUL
INTRAVENOUS | Status: AC
  Filled 2019-09-16: qty 50

## 2019-09-16 MED ORDER — PROPOFOL 500 MG/50ML IV EMUL
INTRAVENOUS | Status: DC | PRN
  Administered 2019-09-16: 200 ug/kg/min via INTRAVENOUS

## 2019-09-16 MED ORDER — BISACODYL 10 MG RE SUPP: 10.0000 mg | Freq: Every day | RECTAL | Status: DC | PRN

## 2019-09-16 MED ORDER — FENTANYL CITRATE (PF) 100 MCG/2ML IJ SOLN
INTRAMUSCULAR | Status: AC
  Filled 2019-09-16: qty 2

## 2019-09-16 MED ORDER — OXYCODONE HCL 5 MG PO TABS: 5.0000 mg | ORAL_TABLET | Freq: Once | ORAL | Status: DC | PRN

## 2019-09-16 MED ORDER — SODIUM CHLORIDE 0.9 % IV SOLN
INTRAVENOUS | Status: DC | PRN
  Administered 2019-09-16: 50 ug/min via INTRAVENOUS
  Administered 2019-09-16: 30 ug/min via INTRAVENOUS

## 2019-09-16 MED ORDER — ALUM & MAG HYDROXIDE-SIMETH 200-200-20 MG/5ML PO SUSP
30.0000 mL | ORAL | Status: DC | PRN
  Administered 2019-09-18: 30 mL via ORAL
  Filled 2019-09-16: qty 30

## 2019-09-16 MED ORDER — OXYCODONE HCL 5 MG PO TABS
5.0000 mg | ORAL_TABLET | ORAL | Status: DC | PRN
  Administered 2019-09-18 (×3): 5 mg via ORAL
  Administered 2019-09-19 – 2019-09-20 (×2): 10 mg via ORAL
  Filled 2019-09-16: qty 2
  Filled 2019-09-16 (×2): qty 1
  Filled 2019-09-16: qty 2
  Filled 2019-09-16: qty 1

## 2019-09-16 MED ORDER — PROPOFOL 10 MG/ML IV BOLUS
INTRAVENOUS | Status: DC | PRN
  Administered 2019-09-16: 30 mg via INTRAVENOUS

## 2019-09-16 MED ORDER — FENTANYL CITRATE (PF) 100 MCG/2ML IJ SOLN: 25.0000 ug | INTRAMUSCULAR | Status: DC | PRN

## 2019-09-16 MED ORDER — DEXTROSE 50 % IV SOLN: 25.0000 mL | Freq: Once | INTRAVENOUS | Status: AC

## 2019-09-16 MED ORDER — ONDANSETRON HCL 4 MG/2ML IJ SOLN
INTRAMUSCULAR | Status: AC
  Filled 2019-09-16: qty 2

## 2019-09-16 MED ORDER — BUPIVACAINE HCL (PF) 0.5 % IJ SOLN
INTRAMUSCULAR | Status: DC | PRN
  Administered 2019-09-16: 2.5 mL

## 2019-09-16 MED ORDER — ENOXAPARIN SODIUM 40 MG/0.4ML ~~LOC~~ SOLN
40.0000 mg | SUBCUTANEOUS | Status: DC
  Administered 2019-09-17 – 2019-09-20 (×4): 40 mg via SUBCUTANEOUS
  Filled 2019-09-16 (×4): qty 0.4

## 2019-09-16 MED ORDER — DOCUSATE SODIUM 100 MG PO CAPS
100.0000 mg | ORAL_CAPSULE | Freq: Two times a day (BID) | ORAL | Status: DC
  Administered 2019-09-16 – 2019-09-20 (×8): 100 mg via ORAL
  Filled 2019-09-16 (×8): qty 1

## 2019-09-16 MED ORDER — OXYCODONE HCL 5 MG/5ML PO SOLN: 5.0000 mg | Freq: Once | ORAL | Status: DC | PRN

## 2019-09-16 MED ORDER — ONDANSETRON HCL 4 MG/2ML IJ SOLN
INTRAMUSCULAR | Status: DC | PRN
  Administered 2019-09-16: 4 mg via INTRAVENOUS

## 2019-09-16 MED ORDER — CEFAZOLIN SODIUM-DEXTROSE 1-4 GM/50ML-% IV SOLN
1.0000 g | Freq: Four times a day (QID) | INTRAVENOUS | Status: AC
  Administered 2019-09-16 – 2019-09-17 (×2): 1 g via INTRAVENOUS
  Filled 2019-09-16 (×3): qty 50

## 2019-09-16 MED ORDER — ACETAMINOPHEN 500 MG PO TABS
1000.0000 mg | ORAL_TABLET | Freq: Four times a day (QID) | ORAL | Status: AC
  Administered 2019-09-16 – 2019-09-17 (×4): 1000 mg via ORAL
  Filled 2019-09-16 (×4): qty 2

## 2019-09-16 MED ORDER — ADULT MULTIVITAMIN W/MINERALS CH
1.0000 | ORAL_TABLET | Freq: Every day | ORAL | Status: DC
  Administered 2019-09-17 – 2019-09-20 (×4): 1 via ORAL
  Filled 2019-09-16 (×4): qty 1

## 2019-09-16 MED ORDER — HYDROMORPHONE HCL 1 MG/ML IJ SOLN: 0.5000 mg | INTRAMUSCULAR | Status: DC | PRN

## 2019-09-16 MED ORDER — KETOROLAC TROMETHAMINE 15 MG/ML IJ SOLN
7.5000 mg | Freq: Four times a day (QID) | INTRAMUSCULAR | Status: AC
  Administered 2019-09-16 – 2019-09-17 (×4): 7.5 mg via INTRAVENOUS
  Filled 2019-09-16 (×4): qty 1

## 2019-09-16 MED ORDER — ONDANSETRON HCL 4 MG PO TABS
4.0000 mg | ORAL_TABLET | Freq: Four times a day (QID) | ORAL | Status: DC | PRN
  Administered 2019-09-16: 4 mg via ORAL
  Filled 2019-09-16: qty 1

## 2019-09-16 MED ORDER — ACETAMINOPHEN 10 MG/ML IV SOLN
INTRAVENOUS | Status: AC
  Filled 2019-09-16: qty 100

## 2019-09-16 MED ORDER — ACETAMINOPHEN 10 MG/ML IV SOLN: 1000.0000 mg | Freq: Once | INTRAVENOUS | Status: DC | PRN

## 2019-09-16 MED ORDER — BUPIVACAINE HCL (PF) 0.5 % IJ SOLN
INTRAMUSCULAR | Status: AC
  Filled 2019-09-16: qty 10

## 2019-09-16 MED ORDER — MIDAZOLAM HCL 2 MG/2ML IJ SOLN
INTRAMUSCULAR | Status: AC
  Filled 2019-09-16: qty 2

## 2019-09-16 MED ORDER — CHLORHEXIDINE GLUCONATE 0.12 % MT SOLN
OROMUCOSAL | Status: AC
  Filled 2019-09-16: qty 15

## 2019-09-16 MED ORDER — DEXTROSE 50 % IV SOLN
INTRAVENOUS | Status: AC
  Administered 2019-09-16: 25 mL via INTRAVENOUS
  Filled 2019-09-16: qty 50

## 2019-09-16 MED ORDER — ACETAMINOPHEN 10 MG/ML IV SOLN
INTRAVENOUS | Status: DC | PRN
  Administered 2019-09-16: 1000 mg via INTRAVENOUS

## 2019-09-16 MED ORDER — OXYCODONE HCL 5 MG PO TABS
10.0000 mg | ORAL_TABLET | ORAL | Status: DC | PRN
  Administered 2019-09-16 – 2019-09-17 (×2): 10 mg via ORAL
  Administered 2019-09-18: 15 mg via ORAL
  Filled 2019-09-16 (×2): qty 2
  Filled 2019-09-16: qty 3

## 2019-09-16 MED ORDER — GABAPENTIN 300 MG PO CAPS
300.0000 mg | ORAL_CAPSULE | Freq: Three times a day (TID) | ORAL | Status: DC
  Administered 2019-09-16 – 2019-09-17 (×4): 300 mg via ORAL
  Filled 2019-09-16 (×5): qty 1

## 2019-09-16 MED ORDER — ACETAMINOPHEN 325 MG PO TABS
325.0000 mg | ORAL_TABLET | Freq: Four times a day (QID) | ORAL | Status: DC | PRN
  Administered 2019-09-17: 650 mg via ORAL
  Filled 2019-09-16: qty 2

## 2019-09-16 MED ORDER — POTASSIUM CHLORIDE IN NACL 20-0.45 MEQ/L-% IV SOLN
INTRAVENOUS | Status: DC
  Filled 2019-09-16 (×9): qty 1000

## 2019-09-16 SURGICAL SUPPLY — 43 items
BIT DRILL 4.3MMS DISTAL GRDTED (BIT) ×1 IMPLANT
BNDG COHESIVE 6X5 TAN STRL LF (GAUZE/BANDAGES/DRESSINGS) ×6 IMPLANT
CANISTER SUCT 1200ML W/VALVE (MISCELLANEOUS) ×3 IMPLANT
COVER WAND RF STERILE (DRAPES) ×3 IMPLANT
DRAPE 3/4 80X56 (DRAPES) ×6 IMPLANT
DRAPE SURG 17X11 SM STRL (DRAPES) ×6 IMPLANT
DRAPE U-SHAPE 47X51 STRL (DRAPES) ×3 IMPLANT
DRILL 4.3MMS DISTAL GRADUATED (BIT) ×3
DRSG OPSITE POSTOP 3X4 (GAUZE/BANDAGES/DRESSINGS) ×6 IMPLANT
DRSG OPSITE POSTOP 4X14 (GAUZE/BANDAGES/DRESSINGS) IMPLANT
DRSG OPSITE POSTOP 4X6 (GAUZE/BANDAGES/DRESSINGS) ×3 IMPLANT
DURAPREP 26ML APPLICATOR (WOUND CARE) ×3 IMPLANT
ELECT REM PT RETURN 9FT ADLT (ELECTROSURGICAL) ×3
ELECTRODE REM PT RTRN 9FT ADLT (ELECTROSURGICAL) ×1 IMPLANT
GLOVE BIOGEL PI IND STRL 9 (GLOVE) ×1 IMPLANT
GLOVE BIOGEL PI INDICATOR 9 (GLOVE) ×2
GLOVE SURG 9.0 ORTHO LTXF (GLOVE) ×6 IMPLANT
GOWN STRL REUS TWL 2XL XL LVL4 (GOWN DISPOSABLE) ×3 IMPLANT
GOWN STRL REUS W/ TWL LRG LVL3 (GOWN DISPOSABLE) ×1 IMPLANT
GOWN STRL REUS W/TWL LRG LVL3 (GOWN DISPOSABLE) ×3
GUIDEPIN VERSANAIL DSP 3.2X444 (ORTHOPEDIC DISPOSABLE SUPPLIES) ×3 IMPLANT
GUIDEWIRE BALL NOSE 100CM (WIRE) ×3 IMPLANT
HEMOVAC 400CC 10FR (MISCELLANEOUS) IMPLANT
HIP FRAC NAIL LEFT 11X360MM (Orthopedic Implant) ×3 IMPLANT
KIT TURNOVER CYSTO (KITS) ×3 IMPLANT
MAT ABSORB  FLUID 56X50 GRAY (MISCELLANEOUS) ×3
MAT ABSORB FLUID 56X50 GRAY (MISCELLANEOUS) ×1 IMPLANT
NAIL HIP FRAC LEFT 11X360MM (Orthopedic Implant) ×1 IMPLANT
NS IRRIG 1000ML POUR BTL (IV SOLUTION) ×3 IMPLANT
PACK HIP COMPR (MISCELLANEOUS) ×3 IMPLANT
PAD ARMBOARD 7.5X6 YLW CONV (MISCELLANEOUS) ×3 IMPLANT
SCREW BONE CORTICAL 5.0X40 (Screw) ×3 IMPLANT
SCREW BONE CORTICAL 5.0X42 (Screw) ×3 IMPLANT
SCREW LAG 10.5MMX105MM HFN (Screw) ×3 IMPLANT
STAPLER SKIN PROX 35W (STAPLE) ×3 IMPLANT
SUCTION FRAZIER HANDLE 10FR (MISCELLANEOUS) ×3
SUCTION TUBE FRAZIER 10FR DISP (MISCELLANEOUS) ×1 IMPLANT
SUT VIC AB 0 CT1 36 (SUTURE) ×6 IMPLANT
SUT VIC AB 2-0 CT1 27 (SUTURE) ×3
SUT VIC AB 2-0 CT1 TAPERPNT 27 (SUTURE) ×1 IMPLANT
SUT VICRYL 0 AB UR-6 (SUTURE) ×3 IMPLANT
SYR 30ML LL (SYRINGE) ×3 IMPLANT
SYR BULB IRRIG 60ML STRL (SYRINGE) ×3 IMPLANT

## 2019-09-16 NOTE — Progress Notes (Signed)
PROGRESS NOTE    Annette Huff  DPO:[id number] DOB: 1960/09/11 DOA: 09/15/2019 PCP: Lauro Regulus, MD  Assessment & Plan:   Principal Problem:   Closed fracture of femur, intertrochanteric, left, initial encounter [facility]) Active Problems:   Coronary artery disease involving native coronary artery of native heart without angina pectoris   Fall at home, initial encounter   Preoperative clearance   Compression fracture of L2 (HCC) - age indeterminate   Diabetes mellitus type 2 with complications (HCC)   MCI (mild cognitive impairment)   History of TIA (transient ischemic attack)   HTN (hypertension)   Hyponatremia   Left closed fracture of femur: secondary to fall at home. CT head with no acute intracranial findings. Will go for intramedullary fixation as per ortho surg. Ortho surg recs apprec   Depression: severity unknown. Will restart lexapro tomorrow   Hyponatremia: resolved  CAD: w/ hx of stent. Not on aspirin, statin or BB at home.   Compression fracture of L2: age indeterminate. Unlikely related to fall  DM2: continue on SSI w/ accuchecks   Mild cognitive impairment: continue w/ neuro outpatient f/u   History of TIA: pt refused MRI on admission. Not on statin or aspirin      DVT prophylaxis: SCDs Code Status: full  Family Communication: discussed pt's care w/ pt's husband who is at bedside and answered their questions  Disposition Plan: likely d/c to SNF   Consultants:   Ortho surg    Procedures:   Antimicrobials:    Subjective: Pt c/o left leg pain   Objective: Vitals:   09/15/19 2221 09/16/19 0231 09/16/19 0612 09/16/19 1014  BP: 109/74 110/79 92/64 117/72  Pulse: 75 77 77 81  Resp: 20 16 18 17   Temp: 98.1 F (36.7 C) 98.5 F (36.9 C) 98.5 F (36.9 C) 99.1 F (37.3 C)  TempSrc: Oral Oral Oral Oral  SpO2: 95% 94% 97% (!) 89%  Weight:      Height:        Intake/Output Summary (Last 24 hours) at 09/16/2019 1428 Last data filed  at 09/16/2019 1030 Gross per 24 hour  Intake 38.62 ml  Output 3450 ml  Net -3411.38 ml   Filed Weights   09/15/19 1801  Weight: 60.3 kg    Examination:  General exam: Appears calm and comfortable  Respiratory system: Clear to auscultation. Respiratory effort normal. Cardiovascular system: S1 & S2 +. No  rubs, gallops or clicks. Gastrointestinal system: Abdomen is nondistended, soft and nontender.  Normal bowel sounds heard. Central nervous system: Alert and oriented. Moves all 4 extremities  Psychiatry: Judgement and insight appear normal. Flat mood and affect.     Data Reviewed: I have personally reviewed following labs and imaging studies  CBC: Recent Labs  Lab 09/15/19 1935 09/16/19 0011  WBC 9.7 7.9  NEUTROABS 8.1*  --   HGB 13.3 14.2  HCT 37.1 38.6  MCV 92.3 91.3  PLT 224 243   Basic Metabolic Panel: Recent Labs  Lab 09/15/19 1935 09/16/19 0817  NA 126* 136  K 3.6 3.5  CL 91* 99  CO2 23 26  GLUCOSE 135* 99  BUN <5* <5*  CREATININE 0.40* 0.57  CALCIUM 8.1* 8.4*   GFR: Estimated Creatinine Clearance: 66.5 mL/min (by C-G formula based on SCr of 0.57 mg/dL). Liver Function Tests: Recent Labs  Lab 09/15/19 1935  AST 22  ALT 17  ALKPHOS 65  BILITOT 0.5  PROT 6.6  ALBUMIN 3.5   No results for input(s):  LIPASE, AMYLASE in the last 168 hours. No results for input(s): AMMONIA in the last 168 hours. Coagulation Profile: Recent Labs  Lab 09/16/19 0011  INR 0.9   Cardiac Enzymes: No results for input(s): CKTOTAL, CKMB, CKMBINDEX, TROPONINI in the last 168 hours. BNP (last 3 results) No results for input(s): PROBNP in the last 8760 hours. HbA1C: Recent Labs    09/15/19 1935  HGBA1C 6.0*   CBG: Recent Labs  Lab 09/16/19 0022 09/16/19 0409 09/16/19 0609 09/16/19 0756 09/16/19 1133  GLUCAP 122* 65* 145* 99 92   Lipid Profile: No results for input(s): CHOL, HDL, LDLCALC, TRIG, CHOLHDL, LDLDIRECT in the last 72 hours. Thyroid Function  Tests: No results for input(s): TSH, T4TOTAL, FREET4, T3FREE, THYROIDAB in the last 72 hours. Anemia Panel: No results for input(s): VITAMINB12, FOLATE, FERRITIN, TIBC, IRON, RETICCTPCT in the last 72 hours. Sepsis Labs: No results for input(s): PROCALCITON, LATICACIDVEN in the last 168 hours.  Recent Results (from the past 240 hour(s))  SARS Coronavirus 2 by RT PCR (hospital order, performed in [facility] hospital lab) Nasopharyngeal Nasopharyngeal Swab     Status: None   Collection Time: 09/15/19  8:08 PM   Specimen: Nasopharyngeal Swab  Result Value Ref Range Status   SARS Coronavirus 2 NEGATIVE NEGATIVE Final    Comment: (NOTE) SARS-CoV-2 target nucleic acids are NOT DETECTED.  The SARS-CoV-2 RNA is generally detectable in upper and lower respiratory specimens during the acute phase of infection. The lowest concentration of SARS-CoV-2 viral copies this assay can detect is 250 copies / mL. A negative result does not preclude SARS-CoV-2 infection and should not be used as the sole basis for treatment or other patient management decisions.  A negative result may occur with improper specimen collection / handling, submission of specimen other than nasopharyngeal swab, presence of viral mutation(s) within the areas targeted by this assay, and inadequate number of viral copies (<250 copies / mL). A negative result must be combined with clinical observations, patient history, and epidemiological information.  Fact Sheet for Patients:   BoilerBrush.com.cyhttps://www.fda.gov/media/136312/download  Fact Sheet for Healthcare Providers: https://pope.com/https://www.fda.gov/media/136313/download  This test is not yet approved or  cleared by the [place] FDA and has been authorized for detection and/or diagnosis of SARS-CoV-2 by FDA under an Emergency Use Authorization (EUA).  This EUA will remain in effect (meaning this test can be used) for the duration of the COVID-19 declaration under Section 564(b)(1) of the  Act, 21 U.S.C. section 360bbb-3(b)(1), unless the authorization is terminated or revoked sooner.  Performed at [facility], [address] [place], [place] [address]   Surgical pcr screen     Status: None   Collection Time: 09/15/19 10:42 PM   Specimen: Nasal Mucosa; Nasal Swab  Result Value Ref Range Status   MRSA, PCR NEGATIVE NEGATIVE Final   Staphylococcus aureus NEGATIVE NEGATIVE Final    Comment: (NOTE) The Xpert SA Assay (FDA approved for NASAL specimens in patients 61 years of age and older), is one component of a comprehensive surveillance program. It is not intended to diagnose infection nor to guide or monitor treatment. Performed at [facility], [address] [place], [place] [address]          Radiology Studies: DG Chest 1 View  Result Date: 09/15/2019 CLINICAL DATA:  Fall.  Left hip fracture. EXAM: CHEST  1 VIEW COMPARISON:  02/27/2013 FINDINGS: Lower cervical plate and screw fixator. Bony demineralization. Apical lordotic projection. The lungs appear clear. No blunting of the  costophrenic angles. Cardiac and mediastinal margins appear normal. IMPRESSION: 1. No active cardiopulmonary disease is radiographically apparent. 2. Bony demineralization. Electronically Signed   By: Gaylyn Rong M.D.   On: 09/15/2019 19:15   DG Lumbar Spine 2-3 Views  Result Date: 09/15/2019 CLINICAL DATA:  59 year old female with fall and back pain. EXAM: LUMBAR SPINE - 2-3 VIEW COMPARISON:  None. FINDINGS: Five lumbar type vertebra. There is mild compression fracture of the superior endplate of L2, age indeterminate. Correlation with clinical exam and point tenderness recommended. No other acute fracture identified. The bones are osteopenic. There is mild multilevel degenerative changes with lower lumbar facet arthropathy. A 3 mm radiopaque focus in the right upper quadrant may represent a renal calculus or stone in the gallbladder versus a vascular  calcification or liver granuloma. There is atherosclerotic calcification of the abdominal aorta. The soft tissues are grossly unremarkable. IMPRESSION: Mild compression fracture of the superior endplate of L2, age indeterminate. Correlation with clinical exam and point tenderness recommended. Electronically Signed   By: Elgie Collard M.D.   On: 09/15/2019 19:15   CT Head Wo Contrast  Result Date: 09/15/2019 CLINICAL DATA:  Larey Seat this morning, possible loss of consciousness, inability to ambulate EXAM: CT HEAD WITHOUT CONTRAST CT CERVICAL SPINE WITHOUT CONTRAST TECHNIQUE: Multidetector CT imaging of the head and cervical spine was performed following the standard protocol without intravenous contrast. Multiplanar CT image reconstructions of the cervical spine were also generated. COMPARISON:  09/20/2018, 09/02/2003 FINDINGS: CT HEAD FINDINGS Brain: No acute infarct or hemorrhage. Lateral ventricles and midline structures are unremarkable. No acute extra-axial fluid collections. No mass effect. Vascular: No hyperdense vessel or unexpected calcification. Skull: Normal. Negative for fracture or focal lesion. Sinuses/Orbits: No acute finding. Other: None. CT CERVICAL SPINE FINDINGS Alignment: Stable straightening of the cervical spine, with mild kyphosis centered at C4. Otherwise alignment is anatomic. Skull base and vertebrae: No acute displaced fracture. Soft tissues and spinal canal: No prevertebral fluid or swelling. No visible canal hematoma. Disc levels: Previous C6/C7 ACDF. There is prominent spondylosis at C4-5 and C5-6 with symmetrical bilateral neural foraminal narrowing. At C3/C4 there is significant bilateral facet hypertrophy and mild bilateral uncovertebral hypertrophy, resulting in symmetrical bilateral neural foraminal encroachment. Upper chest: Airway is patent. Emphysematous changes are seen at the lung apices. Other: Reconstructed images demonstrate no additional findings. IMPRESSION: 1. No acute  intracranial process. 2. No acute cervical spine fracture. 3. Previous C6/C7 ACDF. Prominent spondylosis at C4-5 and C5-6 with symmetrical bilateral neural foraminal narrowing. Electronically Signed   By: Sharlet Salina M.D.   On: 09/15/2019 18:57   CT Cervical Spine Wo Contrast  Result Date: 09/15/2019 CLINICAL DATA:  Larey Seat this morning, possible loss of consciousness, inability to ambulate EXAM: CT HEAD WITHOUT CONTRAST CT CERVICAL SPINE WITHOUT CONTRAST TECHNIQUE: Multidetector CT imaging of the head and cervical spine was performed following the standard protocol without intravenous contrast. Multiplanar CT image reconstructions of the cervical spine were also generated. COMPARISON:  09/20/2018, 09/02/2003 FINDINGS: CT HEAD FINDINGS Brain: No acute infarct or hemorrhage. Lateral ventricles and midline structures are unremarkable. No acute extra-axial fluid collections. No mass effect. Vascular: No hyperdense vessel or unexpected calcification. Skull: Normal. Negative for fracture or focal lesion. Sinuses/Orbits: No acute finding. Other: None. CT CERVICAL SPINE FINDINGS Alignment: Stable straightening of the cervical spine, with mild kyphosis centered at C4. Otherwise alignment is anatomic. Skull base and vertebrae: No acute displaced fracture. Soft tissues and spinal canal: No prevertebral fluid or swelling. No visible canal  hematoma. Disc levels: Previous C6/C7 ACDF. There is prominent spondylosis at C4-5 and C5-6 with symmetrical bilateral neural foraminal narrowing. At C3/C4 there is significant bilateral facet hypertrophy and mild bilateral uncovertebral hypertrophy, resulting in symmetrical bilateral neural foraminal encroachment. Upper chest: Airway is patent. Emphysematous changes are seen at the lung apices. Other: Reconstructed images demonstrate no additional findings. IMPRESSION: 1. No acute intracranial process. 2. No acute cervical spine fracture. 3. Previous C6/C7 ACDF. Prominent spondylosis at  C4-5 and C5-6 with symmetrical bilateral neural foraminal narrowing. Electronically Signed   By: Sharlet Salina M.D.   On: 09/15/2019 18:57   DG Hip Unilat W or Wo Pelvis 2-3 Views Left  Result Date: 09/15/2019 CLINICAL DATA:  Fall, left hip pain. EXAM: DG HIP (WITH OR WITHOUT PELVIS) 2-3V LEFT COMPARISON:  None. FINDINGS: Acute intertrochanteric fracture of the left hip noted (extra-articular). There is clear bony discontinuity extending through the lesser and greater trochanter along with a lucency extending between these discontinuities. Preserved articular spaces in the hips. Bony demineralization. No additional pelvic fractures. IMPRESSION: 1. Acute intertrochanteric fracture of the left hip. 2. Bony demineralization. Electronically Signed   By: Gaylyn Rong M.D.   On: 09/15/2019 19:16        Scheduled Meds:  Chlorhexidine Gluconate Cloth  6 each Topical Daily   insulin aspart  0-9 Units Subcutaneous Q4H   [START ON 09/17/2019] multivitamin with minerals  1 tablet Oral Daily   Continuous Infusions:  sodium chloride 75 mL/hr at 09/16/19 1221    ceFAZolin (ANCEF) IV       LOS: 1 day    Time spent: 32 mins    Charise Killian, MD [facility] Pager [phone]  If 7PM-7AM, please contact night-coverage [email or web address] 09/16/2019, 2:28 PM

## 2019-09-16 NOTE — Transfer of Care (Signed)
Immediate Anesthesia Transfer of Care Note  Patient: [name]  Procedure(s) Performed: INTRAMEDULLARY (IM) NAIL INTERTROCHANTRIC (Left )  Patient Location: PACU  Anesthesia Type:Spinal  Level of Consciousness: awake, alert  and oriented  Airway & Oxygen Therapy: Patient Spontanous Breathing  Post-op Assessment: Report given to RN and Post -op Vital signs reviewed and stable  Post vital signs: Reviewed and stable  Last Vitals:  Vitals Value Taken Time  BP 103/60 09/16/19 1818  Temp    Pulse 77 09/16/19 1831  Resp 24 09/16/19 1831  SpO2 95 % 09/16/19 1831  Vitals shown include unvalidated device data.  Last Pain:  Vitals:   09/16/19 1747  TempSrc:   PainSc: 0-No pain      Patients Stated Pain Goal: 0 (09/16/19 1225)  Complications: No complications documented.

## 2019-09-16 NOTE — Consult Note (Signed)
ORTHOPAEDIC CONSULTATION  REQUESTING PHYSICIAN: Charise Killian, MD  Chief Complaint: Left hip pain status post fall  HPI: Annette Huff is a 59 y.o. female who complains of left hip pain status post fall at home yesterday.  Patient fell overnight and helped to the couch by her husband.  Patient was unable to weight-bear and had significant pain with any movement to her left hip.  Patient therefore presented to the [facility] emergency department where x-rays revealed an acute intertrochanteric left hip fracture.  Patient is seen in her hospital room with her husband at the bedside.  History reviewed. No pertinent past medical history. Past Surgical History:  Procedure Laterality Date  . TUBAL LIGATION     Social History   Socioeconomic History  . Marital status: Married    Spouse name: Not on file  . Number of children: Not on file  . Years of education: Not on file  . Highest education level: Not on file  Occupational History  . Not on file  Tobacco Use  . Smoking status: Current Every Day Smoker    Packs/day: 1.00    Types: Cigarettes  . Smokeless tobacco: Never Used  Vaping Use  . Vaping Use: Never used  Substance and Sexual Activity  . Alcohol use: Not on file  . Drug use: Not on file  . Sexual activity: Not on file  Other Topics Concern  . Not on file  Social History Narrative  . Not on file   Social Determinants of Health   Financial Resource Strain:   . Difficulty of Paying Living Expenses:   Food Insecurity:   . Worried About Programme researcher, broadcasting/film/video in the Last Year:   . Barista in the Last Year:   Transportation Needs:   . Freight forwarder (Medical):   Marland Kitchen Lack of Transportation (Non-Medical):   Physical Activity:   . Days of Exercise per Week:   . Minutes of Exercise per Session:   Stress:   . Feeling of Stress :   Social Connections:   . Frequency of Communication with Friends and Family:   . Frequency of Social Gatherings  with Friends and Family:   . Attends Religious Services:   . Active Member of Clubs or Organizations:   . Attends Banker Meetings:   Marland Kitchen Marital Status:    History reviewed. No pertinent family history. Allergies  Allergen Reactions  . Guaifenesin Other (See Comments)  . Sulfa Antibiotics Other (See Comments)   Prior to Admission medications   Medication Sig Start Date End Date Taking? Authorizing Provider  acetaminophen (TYLENOL) 500 MG tablet Take 500 mg by mouth every 6 (six) hours as needed for mild pain.   Yes [provider]  escitalopram (LEXAPRO) 10 MG tablet Take 10 mg by mouth daily at 6 (six) AM. 07/17/19 10/15/19 Yes [provider]   DG Chest 1 View  Result Date: 09/15/2019 CLINICAL DATA:  Fall.  Left hip fracture. EXAM: CHEST  1 VIEW COMPARISON:  02/27/2013 FINDINGS: Lower cervical plate and screw fixator. Bony demineralization. Apical lordotic projection. The lungs appear clear. No blunting of the costophrenic angles. Cardiac and mediastinal margins appear normal. IMPRESSION: 1. No active cardiopulmonary disease is radiographically apparent. 2. Bony demineralization. Electronically Signed   By: Gaylyn Rong M.D.   On: 09/15/2019 19:15   DG Lumbar Spine 2-3 Views  Result Date: 09/15/2019 CLINICAL DATA:  59 year old female with fall and back pain. EXAM: LUMBAR SPINE -  2-3 VIEW COMPARISON:  None. FINDINGS: Five lumbar type vertebra. There is mild compression fracture of the superior endplate of L2, age indeterminate. Correlation with clinical exam and point tenderness recommended. No other acute fracture identified. The bones are osteopenic. There is mild multilevel degenerative changes with lower lumbar facet arthropathy. A 3 mm radiopaque focus in the right upper quadrant may represent a renal calculus or stone in the gallbladder versus a vascular calcification or liver granuloma. There is atherosclerotic calcification of the abdominal aorta. The  soft tissues are grossly unremarkable. IMPRESSION: Mild compression fracture of the superior endplate of L2, age indeterminate. Correlation with clinical exam and point tenderness recommended. Electronically Signed   By: Elgie Collard M.D.   On: 09/15/2019 19:15   CT Head Wo Contrast  Result Date: 09/15/2019 CLINICAL DATA:  Larey Seat this morning, possible loss of consciousness, inability to ambulate EXAM: CT HEAD WITHOUT CONTRAST CT CERVICAL SPINE WITHOUT CONTRAST TECHNIQUE: Multidetector CT imaging of the head and cervical spine was performed following the standard protocol without intravenous contrast. Multiplanar CT image reconstructions of the cervical spine were also generated. COMPARISON:  09/20/2018, 09/02/2003 FINDINGS: CT HEAD FINDINGS Brain: No acute infarct or hemorrhage. Lateral ventricles and midline structures are unremarkable. No acute extra-axial fluid collections. No mass effect. Vascular: No hyperdense vessel or unexpected calcification. Skull: Normal. Negative for fracture or focal lesion. Sinuses/Orbits: No acute finding. Other: None. CT CERVICAL SPINE FINDINGS Alignment: Stable straightening of the cervical spine, with mild kyphosis centered at C4. Otherwise alignment is anatomic. Skull base and vertebrae: No acute displaced fracture. Soft tissues and spinal canal: No prevertebral fluid or swelling. No visible canal hematoma. Disc levels: Previous C6/C7 ACDF. There is prominent spondylosis at C4-5 and C5-6 with symmetrical bilateral neural foraminal narrowing. At C3/C4 there is significant bilateral facet hypertrophy and mild bilateral uncovertebral hypertrophy, resulting in symmetrical bilateral neural foraminal encroachment. Upper chest: Airway is patent. Emphysematous changes are seen at the lung apices. Other: Reconstructed images demonstrate no additional findings. IMPRESSION: 1. No acute intracranial process. 2. No acute cervical spine fracture. 3. Previous C6/C7 ACDF. Prominent  spondylosis at C4-5 and C5-6 with symmetrical bilateral neural foraminal narrowing. Electronically Signed   By: Sharlet Salina M.D.   On: 09/15/2019 18:57   CT Cervical Spine Wo Contrast  Result Date: 09/15/2019 CLINICAL DATA:  Larey Seat this morning, possible loss of consciousness, inability to ambulate EXAM: CT HEAD WITHOUT CONTRAST CT CERVICAL SPINE WITHOUT CONTRAST TECHNIQUE: Multidetector CT imaging of the head and cervical spine was performed following the standard protocol without intravenous contrast. Multiplanar CT image reconstructions of the cervical spine were also generated. COMPARISON:  09/20/2018, 09/02/2003 FINDINGS: CT HEAD FINDINGS Brain: No acute infarct or hemorrhage. Lateral ventricles and midline structures are unremarkable. No acute extra-axial fluid collections. No mass effect. Vascular: No hyperdense vessel or unexpected calcification. Skull: Normal. Negative for fracture or focal lesion. Sinuses/Orbits: No acute finding. Other: None. CT CERVICAL SPINE FINDINGS Alignment: Stable straightening of the cervical spine, with mild kyphosis centered at C4. Otherwise alignment is anatomic. Skull base and vertebrae: No acute displaced fracture. Soft tissues and spinal canal: No prevertebral fluid or swelling. No visible canal hematoma. Disc levels: Previous C6/C7 ACDF. There is prominent spondylosis at C4-5 and C5-6 with symmetrical bilateral neural foraminal narrowing. At C3/C4 there is significant bilateral facet hypertrophy and mild bilateral uncovertebral hypertrophy, resulting in symmetrical bilateral neural foraminal encroachment. Upper chest: Airway is patent. Emphysematous changes are seen at the lung apices. Other: Reconstructed images demonstrate no additional  findings. IMPRESSION: 1. No acute intracranial process. 2. No acute cervical spine fracture. 3. Previous C6/C7 ACDF. Prominent spondylosis at C4-5 and C5-6 with symmetrical bilateral neural foraminal narrowing. Electronically Signed    By: Sharlet Salina M.D.   On: 09/15/2019 18:57   DG Hip Unilat W or Wo Pelvis 2-3 Views Left  Result Date: 09/15/2019 CLINICAL DATA:  Fall, left hip pain. EXAM: DG HIP (WITH OR WITHOUT PELVIS) 2-3V LEFT COMPARISON:  None. FINDINGS: Acute intertrochanteric fracture of the left hip noted (extra-articular). There is clear bony discontinuity extending through the lesser and greater trochanter along with a lucency extending between these discontinuities. Preserved articular spaces in the hips. Bony demineralization. No additional pelvic fractures. IMPRESSION: 1. Acute intertrochanteric fracture of the left hip. 2. Bony demineralization. Electronically Signed   By: Gaylyn Rong M.D.   On: 09/15/2019 19:16    Positive ROS: All other systems have been reviewed and were otherwise negative with the exception of those mentioned in the HPI and as above.  Physical Exam: General: Alert, no acute distress.  MUSCULOSKELETAL: Left lower extremity: Patient has mild external rotation without significant shortening.  Skin is intact.  She has palpable pedal pulses, intact/light touch and intact motor function distally.  Assessment: Left intertrochanteric hip fracture  Plan: I explained to the patient and her husband the type of left hip fracture she has sustained.  I have recommended intramedullary fixation for her fracture.  I reviewed details of the operation as well as the postoperative course.  I discussed the risks and benefits of surgery. The risks include but are not limited to infection, bleeding requiring blood transfusion, nerve or blood vessel injury, joint stiffness or loss of motion, persistent pain, weakness or instability, malunion, nonunion and hardware failure and the need for further surgery. Medical risks include but are not limited to DVT and pulmonary embolism, myocardial infarction, stroke, pneumonia, respiratory failure and death. Patient understood these risks and wished to proceed.    I reviewed the laboratory and radiographic studies in preparation for this case.  I answered all questions by the patient and her husband.   Juanell Fairly, MD    09/16/2019 2:13 PM

## 2019-09-16 NOTE — Op Note (Signed)
09/16/2019  5:45 PM  PATIENT:  Annette Huff    PRE-OPERATIVE DIAGNOSIS:  LEFT INTERTROCHANTERIC FRACTURE  POST-OPERATIVE DIAGNOSIS:  Same  PROCEDURE:  INTRAMEDULLARY FIXATION FOR LEFT  INTERTROCHANTERIC HIP FRACTURE  SURGEON:  Juanell Fairly, MD  ANESTHESIA:   Spinal  EBL:  50 cc   IMPLANT:  ZIMMER BIOMET AFFIXUS NAIL 14mm x with a 105 mm lag screw and distal interlocking screws 47mm  and 42 mm in length.  PREOPERATIVE INDICATIONS:  Annette Huff is a  59 y.o. female with a diagnosis of LEFT INTERTROCHANTERIC FRACTURE.   I recommended intramedullary fixation for her fracture.  The risks, benefits and alternatives were discussed with the patient and their family.  The risks include but are not limited to infection, bleeding requiring blood transfusion, nerve or blood vessel injury, malunion, nonunion, hardware prominence, hardware failure, leg length discrepancy or change in lower extremity rotation and need for further surgery including hardware removal with conversion to a total hip arthroplasty. Medical risks include but are not limited to DVT and pulmonary embolism, myocardial infarction, stroke, pneumonia, respiratory failure and death. The patient and their husband understood these risks and wished to proceed with surgery.  OPERATIVE PROCEDURE:  The patient was brought to the operating room and placed in the supine position on the fracture table. The patient received spinal anesthesia.  A closed reduction was performed under C-arm guidance.  The fracture reduction was confirmed on both AP and lateral views. After adequate reduction was achieved, a time out was performed to verify the patient's name, date of birth, medical record number, correct site of surgery correct procedure to be performed. The timeout was also used to verify the patient received antibiotics and all appropriate instruments, implants and radiographic studies were available in the room. Once all in  attendance were in agreement, the case began. The patient was prepped and draped in a sterile fashion.  She received preoperative antibiotics with 2 g of Ancef IV.  An incision was made proximal to the greater trochanter in line with the femur. A guidewire was placed over the tip of the greater trochanter and advanced by drill into the proximal femur to the level of the lesser trochanter.  Confirmation of the drill pin position was made on AP and lateral C-arm images.  The threaded guidepin was then overdrilled with the proximal femoral entry reamer.  A ball-tipped guidewire was then advanced down the intramedullary canal, across the fracture, and down the femoral shaft to the knee.  The ball tip guidewire's position was confirmed at both the knee and hip via C-arm imaging. A depth gauge was used to measure the length of the long nail to be used. It was measured to be 360 mm. The actual nail was then inserted into the proximal femur, across the fracture site and down the femoral shaft. Its position was confirmed on AP and lateral C-arm images.  The ball tip guidewire was removed.  Once the nail was completely seated, the drill guide for the lag screw was placed through the guide arm for the Affixus nail. A guidepin was then placed through this drill guide and advanced through the lateral cortex of the femur, across the fracture site and into the femoral head achieving a tip apex distance of less than 25 mm. The length of the drill pin was measured to be 105 mm.  The drill for the lag screw was advanced through the lateral cortex, across the fracture site and up into the  femoral head to the depth of the lag screw..  The lag screw was then advanced by hand into position across the fracture site into the femoral head. Its final position was confirmed on AP and lateral C-arm images. Compression was applied as traction was carefully released. The set screw in the top of the intramedullary rod was tightened by hand  using a screwdriver. It was backed off a quarter turn to allow for compression at the fracture site.  The attention was then turned to placement of the distal interlocking screws. A perfect circle technique was used. 2 small stab incisions were made over the distal interlocking screw holes.  A free hand technique was used to drill both distal interlocking screws. The depth of the screw holes was measured with a depth gauge. The 34mm and 68mm screws were then advanced into position and tightened by hand. Final C-arm images of the entire intramedullary construct were taken in both the AP and lateral planes.   The wounds were irrigated copiously and closed with 0 Vicryl for closure of the deep fascia and 2-0 Vicryl for subcutaneous closure. The skin was approximated with staples. A dry sterile dressing was applied. I was scrubbed and present the entire case and all sharp, sponge and instrument counts were correct at the conclusion of the case. Patient was transferred to a hospital bed and brought to PACU in stable condition.     Kathreen Devoid, MD

## 2019-09-16 NOTE — Anesthesia Preprocedure Evaluation (Signed)
Anesthesia Evaluation  Patient identified by MRN, date of birth, ID band Patient awake    Reviewed: Allergy & Precautions, NPO status , Patient's Chart, lab work & pertinent test results  History of Anesthesia Complications Negative for: history of anesthetic complications  Airway Mallampati: II  TM Distance: >3 FB Neck ROM: Full    Dental  (+) Missing, Poor Dentition, Dental Advisory Given   Pulmonary neg pulmonary ROS, neg sleep apnea, neg COPD, Current Smoker and Patient abstained from smoking.,    Pulmonary exam normal breath sounds clear to auscultation       Cardiovascular Exercise Tolerance: Good METShypertension, + CAD and + Cardiac Stents  (-) Past MI negative cardio ROS  (-) dysrhythmias  Rhythm:Regular Rate:Normal - Systolic murmurs    Neuro/Psych Hx TIA, patient refused MRI on admission TIAnegative psych ROS   GI/Hepatic neg GERD  ,(+)     (-) substance abuse  ,   Endo/Other  diabetes  Renal/GU negative Renal ROS     Musculoskeletal  (+) Arthritis ,   Abdominal   Peds  Hematology   Anesthesia Other Findings History reviewed. No pertinent past medical history.  Reproductive/Obstetrics                             Anesthesia Physical Anesthesia Plan  ASA: III  Anesthesia Plan: General/Spinal   Post-op Pain Management:    Induction: Intravenous  PONV Risk Score and Plan: 3 and Ondansetron, Dexamethasone, Propofol infusion and TIVA  Airway Management Planned: Natural Airway  Additional Equipment: None  Intra-op Plan:   Post-operative Plan:   Informed Consent: I have reviewed the patients History and Physical, chart, labs and discussed the procedure including the risks, benefits and alternatives for the proposed anesthesia with the patient or authorized representative who has indicated his/her understanding and acceptance.       Plan Discussed with: CRNA and  Surgeon  Anesthesia Plan Comments: (Discussed R/B/A of neuraxial anesthesia technique with patient: - rare risks of spinal/epidural hematoma, nerve damage, infection - Risk of PDPH - Risk of nausea and vomiting - Risk of conversion to general anesthesia and its associated risks, including sore throat, damage to lips/teeth/oropharynx, and rare risks such as cardiac and respiratory events.  Patient voiced understanding.)        Anesthesia Quick Evaluation

## 2019-09-16 NOTE — Progress Notes (Signed)
Initial Nutrition Assessment  DOCUMENTATION CODES:   Not applicable  INTERVENTION:   Carnation Instant Breakfast TID- each packet provides 130kcal and 5g protein   Magic cup TID with meals, each supplement provides 290 kcal and 9 grams of protein  MVI daily   Recommend [place]scal with D po BID   NUTRITION DIAGNOSIS:   Increased nutrient needs related to hip fracture as evidenced by increased estimated needs.  GOAL:   Patient will meet greater than or equal to 90% of their needs  MONITOR:   Supplement acceptance, PO intake, Labs, Weight trends, Skin, I & O's  REASON FOR ASSESSMENT:   Consult Hip fracture protocol  ASSESSMENT:   59 y.o. 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 Pt reports decreased appetite and oral intake for several weeks pta r/t sore teeth. Pt reports that she has some teeth that need to be pulled and that they are sore, especially when she chews. Pt has been eating mainly soft foods. Pt reports that she does not like Boost or Ensure and has not been drinking any supplements at home. Pt reports a gradual weight loss over the past couple of years. Per chart, pt is down 22lbs(14%) over the past year; this is not significant. Pt currently NPO pending ortho consult. RD discussed with pt the importance of adequate nutrition needed to support healing. Pt is willing to try chocolate Carnation Instant Breakfast once her diet advances. RD will also add Magic Cups to meal trays. Pt reports that she is hungry today as she has not had anything to eat in 2 days. Pt declines a mechanical soft diet as this time.   Medications reviewed and include: insulin, cefazolin  Labs reviewed: BUN <5(L) cbgs- 122, 65, 145, 99, 92 x 24 hrs AIC 6.0(H)- 7/13  NUTRITION - FOCUSED PHYSICAL EXAM:    Most  Recent Value  Orbital Region No depletion  Upper Arm Region No depletion  Thoracic and Lumbar Region No depletion  Buccal Region No depletion  Temple Region No depletion  Clavicle Bone Region Mild depletion  Clavicle and Acromion Bone Region Mild depletion  Scapular Bone Region No depletion  Dorsal Hand Moderate depletion  Patellar Region Moderate depletion  Anterior Thigh Region Mild depletion  Posterior Calf Region Mild depletion  Edema (RD Assessment) None  Hair Reviewed  Eyes Reviewed  Mouth Reviewed  Skin Reviewed  Nails Reviewed     Diet Order:   Diet Order            Diet NPO time specified  Diet effective midnight                EDUCATION NEEDS:   Education needs have been addressed  Skin:  Skin Assessment: Reviewed RN Assessment  Last BM:  7/13  Height:   Ht Readings from Last 1 Encounters:  09/15/19 5' 5" (1.651 m)    Weight:   Wt Readings from Last 1 Encounters:  09/15/19 60.3 kg    Ideal Body Weight:  56.8 kg  BMI:  Body mass index is 22.13 kg/m.  Estimated Nutritional Needs:   Kcal:  1600-1800kcal/day  Protein:  80-90g/day  Fluid:  1.7-2L/day  Koleen Distance MS, RD, LDN Please refer to [facility] for RD and/or RD on-call/weekend/after hours pager

## 2019-09-17 ENCOUNTER — Encounter: Payer: Self-pay | Admitting: Orthopedic Surgery

## 2019-09-17 DIAGNOSIS — J9621 Acute and chronic respiratory failure with hypoxia: Secondary | ICD-10-CM

## 2019-09-17 DIAGNOSIS — E118 Type 2 diabetes mellitus with unspecified complications: Secondary | ICD-10-CM

## 2019-09-17 DIAGNOSIS — S72142A Displaced intertrochanteric fracture of left femur, initial encounter for closed fracture: Secondary | ICD-10-CM

## 2019-09-17 LAB — GLUCOSE, CAPILLARY
Glucose-Capillary: 101 mg/dL — ABNORMAL HIGH (ref 70–99)
Glucose-Capillary: 102 mg/dL — ABNORMAL HIGH (ref 70–99)
Glucose-Capillary: 102 mg/dL — ABNORMAL HIGH (ref 70–99)
Glucose-Capillary: 111 mg/dL — ABNORMAL HIGH (ref 70–99)
Glucose-Capillary: 80 mg/dL (ref 70–99)
Glucose-Capillary: 83 mg/dL (ref 70–99)
Glucose-Capillary: 99 mg/dL (ref 70–99)

## 2019-09-17 LAB — BASIC METABOLIC PANEL
Anion gap: 3 — ABNORMAL LOW (ref 5–15)
BUN: <5 mg/dL — ABNORMAL LOW (ref 8–23)
CO2: 29 mmol/L (ref 22–32)
Calcium: 7.8 mg/dL — ABNORMAL LOW (ref 8.9–10.3)
Chloride: 104 mmol/L (ref 98–111)
Creatinine, Ser: 0.43 mg/dL — ABNORMAL LOW (ref 0.44–1.00)
GFR calc Af Amer: >60 mL/min (ref >60)
GFR calc non Af Amer: >60 mL/min (ref >60)
Glucose, Bld: 95 mg/dL (ref 70–99)
Potassium: 3.7 mmol/L (ref 3.5–5.1)
Sodium: 136 mmol/L (ref 135–145)

## 2019-09-17 LAB — CBC
HCT: 35.5 % — ABNORMAL LOW (ref 36.0–46.0)
Hemoglobin: 11.8 g/dL — ABNORMAL LOW (ref 12.0–15.0)
MCH: 32.9 pg (ref 26.0–34.0)
MCHC: 33.2 g/dL (ref 30.0–36.0)
MCV: 98.9 fL (ref 80.0–100.0)
Platelets: 188 10*3/uL (ref 150–400)
RBC: 3.59 MIL/uL — ABNORMAL LOW (ref 3.87–5.11)
RDW: 12.4 % (ref 11.5–15.5)
WBC: 5.9 10*3/uL (ref 4.0–10.5)
nRBC: 0 % (ref 0.0–0.2)

## 2019-09-17 NOTE — Progress Notes (Signed)
Subjective:  POD #1 s/p intramedullary fixation for left intertrochanteric hip fracture.   Patient reports left hip pain as mild to moderate.  Patient states she was able to get out of bed with the physical therapists.  Objective:   VITALS:   Vitals:   09/17/19 0415 09/17/19 0750 09/17/19 1142 09/17/19 1549  BP: 122/68 (!) 103/58 104/83 (!) 81/48  Pulse: 66 66 70 67  Resp:  16 16 16   Temp:  98.6 F (37 C) 98.1 F (36.7 C) 98.2 F (36.8 C)  TempSrc:  Oral Oral Oral  SpO2: 97% 98% 98% 94%  Weight:      Height:        PHYSICAL EXAM: Left lower extremity Neurovascular intact Sensation intact distally Intact pulses distally Dorsiflexion/Plantar flexion intact Incision: dressing C/D/I No cellulitis present Compartment soft  LABS  Results for orders placed or performed during the hospital encounter of 09/15/19 (from the past 24 hour(s))  Glucose, capillary     Status: None   Collection Time: 09/16/19  8:06 PM  Result Value Ref Range   Glucose-Capillary 99 70 - 99 mg/dL  Glucose, capillary     Status: None   Collection Time: 09/16/19  8:46 PM  Result Value Ref Range   Glucose-Capillary 96 70 - 99 mg/dL  Glucose, capillary     Status: Abnormal   Collection Time: 09/17/19 12:45 AM  Result Value Ref Range   Glucose-Capillary 101 (H) 70 - 99 mg/dL  CBC     Status: Abnormal   Collection Time: 09/17/19  3:16 AM  Result Value Ref Range   WBC 5.9 4.0 - 10.5 K/uL   RBC 3.59 (L) 3.87 - 5.11 MIL/uL   Hemoglobin 11.8 (L) 12.0 - 15.0 g/dL   HCT 09/19/19 (L) 36 - 46 %   MCV 98.9 80.0 - 100.0 fL   MCH 32.9 26.0 - 34.0 pg   MCHC 33.2 30.0 - 36.0 g/dL   RDW 16.1 09.6 - 04.5 %   Platelets 188 150 - 400 K/uL   nRBC 0.0 0.0 - 0.2 %  Basic metabolic panel     Status: Abnormal   Collection Time: 09/17/19  3:16 AM  Result Value Ref Range   Sodium 136 135 - 145 mmol/L   Potassium 3.7 3.5 - 5.1 mmol/L   Chloride 104 98 - 111 mmol/L   CO2 29 22 - 32 mmol/L   Glucose, Bld 95 70 - 99  mg/dL   BUN <5 (L) 8 - 23 mg/dL   Creatinine, Ser 09/19/19 (L) 0.44 - 1.00 mg/dL   Calcium 7.8 (L) 8.9 - 10.3 mg/dL   GFR calc non Af Amer >60 >60 mL/min   GFR calc Af Amer >60 >60 mL/min   Anion gap 3 (L) 5 - 15  Glucose, capillary     Status: None   Collection Time: 09/17/19  3:37 AM  Result Value Ref Range   Glucose-Capillary 80 70 - 99 mg/dL  Glucose, capillary     Status: None   Collection Time: 09/17/19  7:49 AM  Result Value Ref Range   Glucose-Capillary 83 70 - 99 mg/dL   Comment 1 Notify RN    Comment 2 Document in Chart   Glucose, capillary     Status: Abnormal   Collection Time: 09/17/19 11:42 AM  Result Value Ref Range   Glucose-Capillary 111 (H) 70 - 99 mg/dL   Comment 1 Notify RN    Comment 2 Document in Chart   Glucose, capillary  Status: Abnormal   Collection Time: 09/17/19  4:31 PM  Result Value Ref Range   Glucose-Capillary 102 (H) 70 - 99 mg/dL    DG HIP OPERATIVE UNILAT W OR W/O PELVIS LEFT  Result Date: 09/16/2019 CLINICAL DATA:  Left operative hip EXAM: OPERATIVE left HIP (WITH PELVIS IF PERFORMED)  VIEWS TECHNIQUE: Fluoroscopic spot image(s) were submitted for interpretation post-operatively. COMPARISON:  None. FINDINGS: Four intraop views were submitted for review of IM nail fixation of left femur fracture. Fluoro time 1 minutes 51 seconds IMPRESSION: Status post ORIF of proximal left femur fracture Electronically Signed   By: Jonna Clark M.D.   On: 09/16/2019 17:20   DG FEMUR PORT MIN 2 VIEWS LEFT  Result Date: 09/16/2019 CLINICAL DATA:  Postop IM nail. EXAM: LEFT FEMUR PORTABLE 2 VIEWS COMPARISON:  Preoperative hip radiograph yesterday. FINDINGS: Intramedullary nail with distal locking and trans trochanteric screw fixation of intertrochanteric femur fracture. The fracture is in improved alignment compared to preoperative imaging. No new fracture or periprosthetic lucency. Recent postsurgical change includes air and edema in the soft tissues and lateral  skin staples. IMPRESSION: ORIF of left intertrochanteric femur fracture. No immediate postoperative complication. Electronically Signed   By: Narda Rutherford M.D.   On: 09/16/2019 18:25    Assessment/Plan: 1 Day Post-Op   Principal Problem:   Closed fracture of femur, intertrochanteric, left, initial encounter [facility]) Active Problems:   Coronary artery disease involving native coronary artery of native heart without angina pectoris   Fall at home, initial encounter   Preoperative clearance   Compression fracture of L2 (HCC) - age indeterminate   Diabetes mellitus type 2 with complications (HCC)   MCI (mild cognitive impairment)   History of TIA (transient ischemic attack)   HTN (hypertension)   Hyponatremia  Patient is doing well from orthopedic standpoint postop.  Patient with episode of hypotension earlier.  Hemoglobin hematocrit remained stable.  Continue physical therapy.  Patient is weightbearing as tolerated on left lower extremity.  Continue Lovenox for DVT prophylaxis.   Juanell Fairly , MD 09/17/2019, 7:57 PM

## 2019-09-17 NOTE — Evaluation (Signed)
Occupational Therapy Evaluation Patient Details Name: Annette Huff MRN: [id number] DOB: 1960-07-26 Today's Date: 09/17/2019    History of Present Illness Annette Huff is a 59 year old female who presented to ED after fall, resulting in L femur fx.  Pt is s/p IM nailing on 09/16/19.  Pt's PMH includes CAD, L2 compression fx, DM2, mild cognitive impairment, hx of TIA, and hypertension.   Clinical Impression   [name] presents to OT with LLE pain, weakness, and decreased AROM that impairs her ability to safely and independently complete functional tasks.  Prior to admission, pt was independent in basic ADLs without use of AD.  Her husband works night shifts full time, but provides assistance for household management.  Currently, pt is generally able to complete seated ADLs with setup assist.  She requires moderate assist for lower body dressing 2/2 inability to reach L foot and assistance needed for balance while standing to pull pants over hips.  OTR provided stand by assist for supine <> sit in bed mobility.  Did not assess OOB mobility 2/2 pt's pain, but pt required min guard for functional mobility with RW per PT report.  OTR provided education re: fall and safety precautions, self care, pain and compression stocking mgmt to pt and her spouse.  [name] will continue to benefit from skilled OT services in acute setting to address pain, ROM, balance, and safety and independence in ADLs.  Recommend HHOT upon discharge.    Follow Up Recommendations  Home health OT;Supervision - Intermittent    Equipment Recommendations  3 in 1 bedside commode;Tub/shower bench    Recommendations for Other Services       Precautions / Restrictions Precautions Precautions: Fall Restrictions Weight Bearing Restrictions: Yes LLE Weight Bearing: Weight bearing as tolerated      Mobility Bed Mobility Overal bed mobility: Needs Assistance Bed Mobility: Supine to Sit;Sit to Supine     Supine to sit:  Supervision Sit to supine: Supervision      Transfers                 General transfer comment: not tested in OT 2/2 pt's pain, but requires min assist with RW per PT report    Balance Overall balance assessment: Needs assistance Sitting-balance support: Feet supported;No upper extremity supported Sitting balance-Leahy Scale: Fair         Standing balance comment: not tested                           ADL either performed or assessed with clinical judgement   ADL Overall ADL's : Needs assistance/impaired Eating/Feeding: Set up;Sitting   Grooming: Set up;Sitting;Oral care Grooming Details (indicate cue type and reason): OTR provided setup assist for oral hygiene at bedlevel Upper Body Bathing: Set up;Sitting     Lower Body Bathing Details (indicate cue type and reason): not tested Upper Body Dressing : Set up;Sitting   Lower Body Dressing: Moderate assistance;Sit to/from stand Lower Body Dressing Details (indicate cue type and reason): Pt requires assist to don L footwear, min assist needed for sit to stand to pull pants over hips   Toilet Transfer Details (indicate cue type and reason): not tested   Toileting - Clothing Manipulation Details (indicate cue type and reason): not tested   Tub/Shower Transfer Details (indicate cue type and reason): not tested   General ADL Comments: Per PT report, pt is min guard in functional mobility with RW  Vision Baseline Vision/History: Wears glasses Wears Glasses: Reading only Patient Visual Report: No change from baseline Vision Assessment?: No apparent visual deficits     Perception     Praxis      Pertinent Vitals/Pain Pain Assessment: Faces Faces Pain Scale: Hurts little more (Pt denies pain at rest, but endorses "bearable" pain with movement) Pain Location: L hip Pain Descriptors / Indicators: Guarding;Discomfort;Grimacing Pain Intervention(s): Limited activity within patient's tolerance;Monitored  during session;Patient requesting pain meds-RN notified     Hand Dominance Right   Extremity/Trunk Assessment Upper Extremity Assessment Upper Extremity Assessment: Overall WFL for tasks assessed   Lower Extremity Assessment Lower Extremity Assessment: LLE deficits/detail LLE Deficits / Details: expected post-op pain/ROM/strength deficits LLE: Unable to fully assess due to pain       Communication Communication Communication: No difficulties   Cognition Arousal/Alertness: Awake/alert Behavior During Therapy: WFL for tasks assessed/performed Overall Cognitive Status: Within Functional Limits for tasks assessed                                 General Comments: pt with MCI at baseline, grossly oriented throughout evaluation   General Comments       Exercises Other Exercises Other Exercises: Provided education re: OT role and plan of care, fall and safety precautions, self care, bed mobility, pain management strategies, compression stocking management   Shoulder Instructions      Home Living Family/patient expects to be discharged to:: Private residence Living Arrangements: Spouse/significant other Available Help at Discharge: Family;Available 24 hours/day (pt's husband works night shift, but they have friends and family who could stay with pt at night if needed) Type of Home: Apartment Home Access: Stairs to enter [employer] of Steps: 4 on sidewalk outside of apartment, 1 to enter house Entrance Stairs-Rails: Right;Left Home Layout: One level     Bathroom Shower/Tub: Chief Strategy Officer: Standard     Home Equipment: None          Prior Functioning/Environment Level of Independence: Needs assistance  Gait / Transfers Assistance Needed: Pt reports being independent in functional mobility without AD ADL's / Homemaking Assistance Needed: Pt generally able to complete basic ADLs independently.  She still drives short distances  (not highway or at night). Pt's husband does majority of cooking and cleaning in household.  Pt is independent in medication management.   Comments: Pt denies fall history other than fall resulting in admission        OT Problem List: Decreased strength;Decreased range of motion;Decreased activity tolerance;Impaired balance (sitting and/or standing);Decreased knowledge of use of DME or AE;Decreased knowledge of precautions;Pain      OT Treatment/Interventions: Self-care/ADL training;Therapeutic exercise;DME and/or AE instruction;Therapeutic activities;Patient/family education;Balance training    OT Goals(Current goals can be found in the care plan section) Acute Rehab OT Goals Patient Stated Goal: to go home OT Goal Formulation: With patient/family Time For Goal Achievement: 10/01/19 Potential to Achieve Goals: Good  OT Frequency: Min 2X/week   Barriers to D/C:            Co-evaluation              AM-PAC OT "6 Clicks" Daily Activity     Outcome Measure Help from another person eating meals?: None Help from another person taking care of personal grooming?: None Help from another person toileting, which includes using toliet, bedpan, or urinal?: A Little Help from another person bathing (including washing,  rinsing, drying)?: A Little Help from another person to put on and taking off regular upper body clothing?: None Help from another person to put on and taking off regular lower body clothing?: A Lot 6 Click Score: 20   End of Session Nurse Communication: Patient requests pain meds;Other (comment) (pt concerned about small amount of bleeding in IV, RN notified)  Activity Tolerance: Patient tolerated treatment well Patient left: in bed;with call bell/phone within reach;with family/visitor present  OT Visit Diagnosis: Other abnormalities of gait and mobility (R26.89);Pain Pain - Right/Left: Left Pain - part of body: Leg                Time: 3817-7116 OT Time  Calculation (min): 35 min Charges:  OT General Charges $OT Visit: 1 Visit OT Evaluation $OT Eval Moderate Complexity: 1 Mod OT Treatments $Self Care/Home Management : 23-37 mins  Kathyrn Drown Alaina Donati, OTR/L 09/17/19, 10:56 AM

## 2019-09-17 NOTE — Progress Notes (Signed)
PROGRESS NOTE    Annette PoliceLisa G Huff  WJX:[id number] DOB: 09/13/1960 DOA: 09/15/2019 PCP: Lauro RegulusAnderson, Marshall W, MD  Assessment & Plan:   Principal Problem:   Closed fracture of femur, intertrochanteric, left, initial encounter [facility](HCC) Active Problems:   Coronary artery disease involving native coronary artery of native heart without angina pectoris   Fall at home, initial encounter   Preoperative clearance   Compression fracture of L2 (HCC) - age indeterminate   Diabetes mellitus type 2 with complications (HCC)   MCI (mild cognitive impairment)   History of TIA (transient ischemic attack)   HTN (hypertension)   Hyponatremia   Left closed fracture of femur: secondary to fall at home. CT head with no acute intracranial findings. S/p intramedullary fixation on 09/16/19. Ortho surg recs apprec. PT/OT recs home health. Will place home health orders  Hypoxia: occurred during surgery. Continue on supplemental oxygen and wean as tolerated. CXR was normal. Normal WBC  Depression: severity unknown. Will restart lexapro   Hyponatremia: resolved  CAD: w/ hx of stent. Not on aspirin, statin or BB at home.   Compression fracture of L2: age indeterminate. Unlikely related to fall  DM2: continue on SSI w/ accuchecks   Mild cognitive impairment: continue w/ neuro outpatient f/u   History of TIA: pt refused MRI on admission. Not on statin or aspirin      DVT prophylaxis: SCDs Code Status: full  Family Communication: discussed pt's care w/ pt's husband who is at bedside and answered his questions  Disposition Plan: likely d/c home w/ home health    Status is: Inpatient  Remains inpatient appropriate because:Unsafe d/c plan, HH needs to be set up and pt needs to be weaned from supplemental oxygen    Dispo: The patient is from: Home              Anticipated d/c is to: [facility];/ [facility]               Anticipated d/c date is: 1 day              Patient currently is not medically stable to  d/c.    Consultants:   Ortho surg    Procedures:   Antimicrobials:    Subjective: Pt c/o fatigue  Objective: Vitals:   09/17/19 0050 09/17/19 0335 09/17/19 0415 09/17/19 0750  BP: 93/67 116/71 122/68 (!) 103/58  Pulse: 64 75 66 66  Resp: 16 18  16   Temp: 98.5 F (36.9 C) 98.6 F (37 C)  98.6 F (37 C)  TempSrc: Oral Oral  Oral  SpO2: 98% 97% 97% 98%  Weight:      Height:        Intake/Output Summary (Last 24 hours) at 09/17/2019 0752 Last data filed at 09/17/2019 0340 Gross per 24 hour  Intake 1677.03 ml  Output 2875 ml  Net -1197.97 ml   Filed Weights   09/15/19 1801  Weight: 60.3 kg    Examination:  General exam: Appears calm and comfortable  Respiratory system: Clear to auscultation. Respiratory effort normal. No rales, rhonchi Cardiovascular system: S1 & S2 +. No  rubs, gallops or clicks. Gastrointestinal system: Abdomen is nondistended, soft and nontender.  Normal bowel sounds heard. Central nervous system: Alert and oriented. Moves all 4 extremities  Psychiatry: Judgement and insight appear normal. Normal mood and affect.     Data Reviewed: I have personally reviewed following labs and imaging studies  CBC: Recent Labs  Lab 09/15/19 1935 09/16/19 0011 09/17/19 [id number]  WBC 9.7 7.9 5.9  NEUTROABS 8.1*  --   --   HGB 13.3 14.2 11.8*  HCT 37.1 38.6 35.5*  MCV 92.3 91.3 98.9  PLT 224 243 188   Basic Metabolic Panel: Recent Labs  Lab 09/15/19 1935 09/16/19 0817 09/16/19 1940 09/17/19 0316  NA 126* 136 136 136  K 3.6 3.5  --  3.7  CL 91* 99  --  104  CO2 23 26  --  29  GLUCOSE 135* 99  --  95  BUN <5* <5*  --  <5*  CREATININE 0.40* 0.57  --  0.43*  CALCIUM 8.1* 8.4*  --  7.8*   GFR: Estimated Creatinine Clearance: 66.5 mL/min (A) (by C-G formula based on SCr of 0.43 mg/dL (L)). Liver Function Tests: Recent Labs  Lab 09/15/19 1935  AST 22  ALT 17  ALKPHOS 65  BILITOT 0.5  PROT 6.6  ALBUMIN 3.5   No results for input(s):  LIPASE, AMYLASE in the last 168 hours. No results for input(s): AMMONIA in the last 168 hours. Coagulation Profile: Recent Labs  Lab 09/16/19 0011  INR 0.9   Cardiac Enzymes: No results for input(s): CKTOTAL, CKMB, CKMBINDEX, TROPONINI in the last 168 hours. BNP (last 3 results) No results for input(s): PROBNP in the last 8760 hours. HbA1C: Recent Labs    09/15/19 1935  HGBA1C 6.0*   CBG: Recent Labs  Lab 09/16/19 1750 09/16/19 2046 09/17/19 0045 09/17/19 0337 09/17/19 0749  GLUCAP 79 96 101* 80 83   Lipid Profile: No results for input(s): CHOL, HDL, LDLCALC, TRIG, CHOLHDL, LDLDIRECT in the last 72 hours. Thyroid Function Tests: No results for input(s): TSH, T4TOTAL, FREET4, T3FREE, THYROIDAB in the last 72 hours. Anemia Panel: No results for input(s): VITAMINB12, FOLATE, FERRITIN, TIBC, IRON, RETICCTPCT in the last 72 hours. Sepsis Labs: No results for input(s): PROCALCITON, LATICACIDVEN in the last 168 hours.  Recent Results (from the past 240 hour(s))  SARS Coronavirus 2 by RT PCR (hospital order, performed in [facility] hospital lab) Nasopharyngeal Nasopharyngeal Swab     Status: None   Collection Time: 09/15/19  8:08 PM   Specimen: Nasopharyngeal Swab  Result Value Ref Range Status   SARS Coronavirus 2 NEGATIVE NEGATIVE Final    Comment: (NOTE) SARS-CoV-2 target nucleic acids are NOT DETECTED.  The SARS-CoV-2 RNA is generally detectable in upper and lower respiratory specimens during the acute phase of infection. The lowest concentration of SARS-CoV-2 viral copies this assay can detect is 250 copies / mL. A negative result does not preclude SARS-CoV-2 infection and should not be used as the sole basis for treatment or other patient management decisions.  A negative result may occur with improper specimen collection / handling, submission of specimen other than nasopharyngeal swab, presence of viral mutation(s) within the areas targeted by this assay, and  inadequate number of viral copies (<250 copies / mL). A negative result must be combined with clinical observations, patient history, and epidemiological information.  Fact Sheet for Patients:   BoilerBrush.com.cy  Fact Sheet for Healthcare Providers: https://pope.com/  This test is not yet approved or  cleared by the [place] FDA and has been authorized for detection and/or diagnosis of SARS-CoV-2 by FDA under an Emergency Use Authorization (EUA).  This EUA will remain in effect (meaning this test can be used) for the duration of the COVID-19 declaration under Section 564(b)(1) of the Act, 21 U.S.C. section 360bbb-3(b)(1), unless the authorization is terminated or revoked sooner.  Performed at [facility], [address], [place], [place] [address]   Surgical pcr screen     Status: None   Collection Time: 09/15/19 10:42 PM   Specimen: Nasal Mucosa; Nasal Swab  Result Value Ref Range Status   MRSA, PCR NEGATIVE NEGATIVE Final   Staphylococcus aureus NEGATIVE NEGATIVE Final    Comment: (NOTE) The Xpert SA Assay (FDA approved for NASAL specimens in patients 10 years of age and older), is one component of a comprehensive surveillance program. It is not intended to diagnose infection nor to guide or monitor treatment. Performed at [facility], [address], [place], [place] [address]          Radiology Studies: DG Chest 1 View  Result Date: 09/15/2019 CLINICAL DATA:  Fall.  Left hip fracture. EXAM: CHEST  1 VIEW COMPARISON:  02/27/2013 FINDINGS: Lower cervical plate and screw fixator. Bony demineralization. Apical lordotic projection. The lungs appear clear. No blunting of the costophrenic angles. Cardiac and mediastinal margins appear normal. IMPRESSION: 1. No active cardiopulmonary disease is radiographically apparent. 2. Bony demineralization. Electronically Signed   By: Gaylyn Rong M.D.    On: 09/15/2019 19:15   DG Lumbar Spine 2-3 Views  Result Date: 09/15/2019 CLINICAL DATA:  59 year old female with fall and back pain. EXAM: LUMBAR SPINE - 2-3 VIEW COMPARISON:  None. FINDINGS: Five lumbar type vertebra. There is mild compression fracture of the superior endplate of L2, age indeterminate. Correlation with clinical exam and point tenderness recommended. No other acute fracture identified. The bones are osteopenic. There is mild multilevel degenerative changes with lower lumbar facet arthropathy. A 3 mm radiopaque focus in the right upper quadrant may represent a renal calculus or stone in the gallbladder versus a vascular calcification or liver granuloma. There is atherosclerotic calcification of the abdominal aorta. The soft tissues are grossly unremarkable. IMPRESSION: Mild compression fracture of the superior endplate of L2, age indeterminate. Correlation with clinical exam and point tenderness recommended. Electronically Signed   By: Elgie Collard M.D.   On: 09/15/2019 19:15   CT Head Wo Contrast  Result Date: 09/15/2019 CLINICAL DATA:  Larey Seat this morning, possible loss of consciousness, inability to ambulate EXAM: CT HEAD WITHOUT CONTRAST CT CERVICAL SPINE WITHOUT CONTRAST TECHNIQUE: Multidetector CT imaging of the head and cervical spine was performed following the standard protocol without intravenous contrast. Multiplanar CT image reconstructions of the cervical spine were also generated. COMPARISON:  09/20/2018, 09/02/2003 FINDINGS: CT HEAD FINDINGS Brain: No acute infarct or hemorrhage. Lateral ventricles and midline structures are unremarkable. No acute extra-axial fluid collections. No mass effect. Vascular: No hyperdense vessel or unexpected calcification. Skull: Normal. Negative for fracture or focal lesion. Sinuses/Orbits: No acute finding. Other: None. CT CERVICAL SPINE FINDINGS Alignment: Stable straightening of the cervical spine, with mild kyphosis centered at C4.  Otherwise alignment is anatomic. Skull base and vertebrae: No acute displaced fracture. Soft tissues and spinal canal: No prevertebral fluid or swelling. No visible canal hematoma. Disc levels: Previous C6/C7 ACDF. There is prominent spondylosis at C4-5 and C5-6 with symmetrical bilateral neural foraminal narrowing. At C3/C4 there is significant bilateral facet hypertrophy and mild bilateral uncovertebral hypertrophy, resulting in symmetrical bilateral neural foraminal encroachment. Upper chest: Airway is patent. Emphysematous changes are seen at the lung apices. Other: Reconstructed images demonstrate no additional findings. IMPRESSION: 1. No acute intracranial process. 2. No acute cervical spine fracture. 3. Previous C6/C7 ACDF. Prominent spondylosis at C4-5 and C5-6 with symmetrical bilateral neural foraminal narrowing. Electronically Signed   By: Casimiro Needle  Manson Passey M.D.   On: 09/15/2019 18:57   CT Cervical Spine Wo Contrast  Result Date: 09/15/2019 CLINICAL DATA:  Larey Seat this morning, possible loss of consciousness, inability to ambulate EXAM: CT HEAD WITHOUT CONTRAST CT CERVICAL SPINE WITHOUT CONTRAST TECHNIQUE: Multidetector CT imaging of the head and cervical spine was performed following the standard protocol without intravenous contrast. Multiplanar CT image reconstructions of the cervical spine were also generated. COMPARISON:  09/20/2018, 09/02/2003 FINDINGS: CT HEAD FINDINGS Brain: No acute infarct or hemorrhage. Lateral ventricles and midline structures are unremarkable. No acute extra-axial fluid collections. No mass effect. Vascular: No hyperdense vessel or unexpected calcification. Skull: Normal. Negative for fracture or focal lesion. Sinuses/Orbits: No acute finding. Other: None. CT CERVICAL SPINE FINDINGS Alignment: Stable straightening of the cervical spine, with mild kyphosis centered at C4. Otherwise alignment is anatomic. Skull base and vertebrae: No acute displaced fracture. Soft tissues and  spinal canal: No prevertebral fluid or swelling. No visible canal hematoma. Disc levels: Previous C6/C7 ACDF. There is prominent spondylosis at C4-5 and C5-6 with symmetrical bilateral neural foraminal narrowing. At C3/C4 there is significant bilateral facet hypertrophy and mild bilateral uncovertebral hypertrophy, resulting in symmetrical bilateral neural foraminal encroachment. Upper chest: Airway is patent. Emphysematous changes are seen at the lung apices. Other: Reconstructed images demonstrate no additional findings. IMPRESSION: 1. No acute intracranial process. 2. No acute cervical spine fracture. 3. Previous C6/C7 ACDF. Prominent spondylosis at C4-5 and C5-6 with symmetrical bilateral neural foraminal narrowing. Electronically Signed   By: Sharlet Salina M.D.   On: 09/15/2019 18:57   DG HIP OPERATIVE UNILAT W OR W/O PELVIS LEFT  Result Date: 09/16/2019 CLINICAL DATA:  Left operative hip EXAM: OPERATIVE left HIP (WITH PELVIS IF PERFORMED)  VIEWS TECHNIQUE: Fluoroscopic spot image(s) were submitted for interpretation post-operatively. COMPARISON:  None. FINDINGS: Four intraop views were submitted for review of IM nail fixation of left femur fracture. Fluoro time 1 minutes 51 seconds IMPRESSION: Status post ORIF of proximal left femur fracture Electronically Signed   By: Jonna Clark M.D.   On: 09/16/2019 17:20   DG Hip Unilat W or Wo Pelvis 2-3 Views Left  Result Date: 09/15/2019 CLINICAL DATA:  Fall, left hip pain. EXAM: DG HIP (WITH OR WITHOUT PELVIS) 2-3V LEFT COMPARISON:  None. FINDINGS: Acute intertrochanteric fracture of the left hip noted (extra-articular). There is clear bony discontinuity extending through the lesser and greater trochanter along with a lucency extending between these discontinuities. Preserved articular spaces in the hips. Bony demineralization. No additional pelvic fractures. IMPRESSION: 1. Acute intertrochanteric fracture of the left hip. 2. Bony demineralization.  Electronically Signed   By: Gaylyn Rong M.D.   On: 09/15/2019 19:16   DG FEMUR PORT MIN 2 VIEWS LEFT  Result Date: 09/16/2019 CLINICAL DATA:  Postop IM nail. EXAM: LEFT FEMUR PORTABLE 2 VIEWS COMPARISON:  Preoperative hip radiograph yesterday. FINDINGS: Intramedullary nail with distal locking and trans trochanteric screw fixation of intertrochanteric femur fracture. The fracture is in improved alignment compared to preoperative imaging. No new fracture or periprosthetic lucency. Recent postsurgical change includes air and edema in the soft tissues and lateral skin staples. IMPRESSION: ORIF of left intertrochanteric femur fracture. No immediate postoperative complication. Electronically Signed   By: Narda Rutherford M.D.   On: 09/16/2019 18:25        Scheduled Meds: . acetaminophen  1,000 mg Oral Q6H  . Chlorhexidine Gluconate Cloth  6 each Topical Daily  . docusate sodium  100 mg Oral BID  . enoxaparin (LOVENOX) injection  40 mg Subcutaneous Q24H  . escitalopram  10 mg Oral Q0600  . gabapentin  300 mg Oral TID  . insulin aspart  0-9 Units Subcutaneous Q4H  . ketorolac  7.5 mg Intravenous Q6H  . multivitamin with minerals  1 tablet Oral Daily  . senna  1 tablet Oral BID   Continuous Infusions: . 0.45 % NaCl with KCl 20 mEq / L 75 mL/hr at 09/16/19 2051  . methocarbamol (ROBAXIN) IV       LOS: 2 days    Time spent: 33 mins    Charise Killian, MD [facility] Pager [phone]  If 7PM-7AM, please contact night-coverage [email or web address] 09/17/2019, 7:52 AM

## 2019-09-17 NOTE — Evaluation (Signed)
Physical Therapy Evaluation Patient Details Name: Annette Huff MRN: [id number] DOB: 01/18/1961 Today's Date: 09/17/2019   History of Present Illness  59 year old female who presented to ED after fall, resulting in L femur fx.  Pt is s/p IM nailing on 09/16/19.  Pt's PMH includes CAD, L2 compression fx, DM2, mild cognitive impairment, hx of TIA, and hypertension.  Clinical Impression  Pt initially hesitant to do a lot with PT, but after showing good effort and strength with supine exercises she had increased confidence and quality of motion with increased effort.  Pt was able to ambulate ~100 ft with safe but stop-go walker use and did not have excessive fatigue on 2 L O2.  We did trial room air during light supine exercises with sats dropping to the mid 80s, reapplied 2L and back to 90s relatively quickly.     Follow Up Recommendations Home health PT;Follow surgeon's recommendation for DC plan and follow-up therapies    Equipment Recommendations  Rolling walker with 5" wheels (3-in-1 per progress)    Recommendations for Other Services       Precautions / Restrictions Precautions Precautions: Fall Restrictions Weight Bearing Restrictions: Yes LLE Weight Bearing: Weight bearing as tolerated      Mobility  Bed Mobility Overal bed mobility: Needs Assistance Bed Mobility: Supine to Sit     Supine to sit: Supervision Sit to supine: Supervision   General bed mobility comments: Pt with only very minimal need to use UEs to assist L LE to EOB, able to get to EOB w/o phyiscal assist   Transfers Overall transfer level: Modified independent Equipment used: Rolling walker (2 wheeled)             General transfer comment: Pt was able to rise to standing w/ only minimal cuing for positioning/walker use/sequencing and WBing  Ambulation/Gait Ambulation/Gait assistance: Min guard Gait Distance (Feet): 100 Feet Assistive device: Rolling walker (2 wheeled)       General Gait  Details: Pt did well with first bout of ambulation post sx.  She was unable to maintain consistent walker movement during L stance phase, but overall was safe and confident with the effort.   Stairs            Wheelchair Mobility    Modified Rankin (Stroke Patients Only)       Balance Overall balance assessment: Needs assistance Sitting-balance support: Feet supported;No upper extremity supported Sitting balance-Leahy Scale: Fair         Standing balance comment: not tested                             Pertinent Vitals/Pain Pain Assessment: 0-10 Pain Score: 4  Faces Pain Scale: Hurts little more (Pt denies pain at rest, but endorses "bearable" pain with movement) Pain Location: L hip Pain Descriptors / Indicators: Guarding;Discomfort;Grimacing Pain Intervention(s): Limited activity within patient's tolerance;Monitored during session;Patient requesting pain meds-RN notified    Home Living Family/patient expects to be discharged to:: Private residence Living Arrangements: Spouse/significant other Available Help at Discharge: Family;Available 24 hours/day (husband works night shift, family available if needed) Type of Home: Apartment Home Access: Stairs to enter Entrance Stairs-Rails: Doctor, general practice of Steps: 4 on sidewalk outside of apartment, 1 to enter house Home Layout: One level Home Equipment: None      Prior Function Level of Independence: Needs assistance   Gait / Transfers Assistance Needed: Pt reports being independent in functional mobility  without AD  ADL's / Homemaking Assistance Needed: Pt generally able to complete basic ADLs independently.  She still drives short distances (not highway or at night). Pt's husband does majority of cooking and cleaning in household.  Pt is independent in medication management.  Comments: Pt denies fall history other than fall resulting in admission     Hand Dominance   Dominant Hand:  Right    Extremity/Trunk Assessment   Upper Extremity Assessment Upper Extremity Assessment: Overall WFL for tasks assessed    Lower Extremity Assessment Lower Extremity Assessment: Overall WFL for tasks assessed LLE Deficits / Details: expected post-op pain/ROM/strength deficits, light assist  LLE: Unable to fully assess due to pain       Communication   Communication: No difficulties  Cognition Arousal/Alertness: Awake/alert Behavior During Therapy: WFL for tasks assessed/performed Overall Cognitive Status: Within Functional Limits for tasks assessed                                 General Comments: pt with MCI at baseline, grossly oriented throughout evaluation      General Comments General comments (skin integrity, edema, etc.): Pt's O2 in the high 90s on 2L on arrival, trialed light in-bed actvity/exercises on room air but sats dropped quickly to the mid 80s, back to mid 90s on 2L relatively quickly.      Exercises General Exercises - Lower Extremity Ankle Circles/Pumps: AROM;10 reps Quad Sets: Strengthening;10 reps Short Arc Quad: Strengthening;10 reps Heel Slides: Strengthening;10 reps (with resisted leg extensions) Hip ABduction/ADduction: Strengthening;10 reps Other Exercises Other Exercises: Provided education re: OT role and plan of care, fall and safety precautions, self care, bed mobility, pain management strategies, compression stocking management   Assessment/Plan    PT Assessment Patient needs continued PT services  PT Problem List Decreased strength;Decreased range of motion;Decreased activity tolerance;Decreased balance;Decreased mobility;Decreased coordination;Decreased knowledge of use of DME;Decreased safety awareness;Pain       PT Treatment Interventions DME instruction;Gait training;Stair training;Functional mobility training;Therapeutic activities;Therapeutic exercise;Balance training;Neuromuscular re-education;Patient/family  education    PT Goals (Current goals can be found in the Care Plan section)  Acute Rehab PT Goals Patient Stated Goal: to go home PT Goal Formulation: With patient/family Time For Goal Achievement: 10/01/19 Potential to Achieve Goals: Good    Frequency BID   Barriers to discharge        Co-evaluation               AM-PAC PT "6 Clicks" Mobility  Outcome Measure Help needed turning from your back to your side while in a flat bed without using bedrails?: None Help needed moving from lying on your back to sitting on the side of a flat bed without using bedrails?: None Help needed moving to and from a bed to a chair (including a wheelchair)?: None Help needed standing up from a chair using your arms (e.g., wheelchair or bedside chair)?: A Little Help needed to walk in hospital room?: A Little Help needed climbing 3-5 steps with a railing? : A Little 6 Click Score: 21    End of Session Equipment Utilized During Treatment: Gait belt Activity Tolerance: Patient tolerated treatment well Patient left: with chair alarm set;with call bell/phone within reach Nurse Communication: Mobility status (attemp to wean O2) PT Visit Diagnosis: Muscle weakness (generalized) (M62.81);Difficulty in walking, not elsewhere classified (R26.2)    Time: 8588-5027 PT Time Calculation (min) (ACUTE ONLY): 55 min   Charges:  PT Evaluation $PT Eval Low Complexity: 1 Low PT Treatments $Gait Training: 8-22 mins $Therapeutic Exercise: 8-22 mins        Malachi Pro, DPT 09/17/2019, 12:53 PM

## 2019-09-17 NOTE — TOC Progression Note (Signed)
Transition of Care [facility]) - Progression Note    Patient Details  Name: DORIEN BESSENT MRN: [id number] Date of Birth: 09-18-1960  [facility] CM/SW Contact  Ciella Obi, Lemar Livings, LCSW Phone Number: 09/17/2019, 1:49 PM  Clinical Narrative:  Checked with every home health agency and [facility] will take but not be able to see until Monday. Pt made aware of this and is in agreement with this. Working on Dc needs.     Expected Discharge Plan: Home w Home Health Services Barriers to Discharge: Continued Medical Work up  Expected Discharge Plan and Services Expected Discharge Plan: Home w Home Health Services In-house Referral: Clinical Social Work   Post Acute Care Choice: Home Health, Durable Medical Equipment Living arrangements for the past 2 months: Apartment                 DME Arranged: Walker rolling DME Agency: [employer] Date DME Agency Contacted: 09/17/19 Time DME Agency Contacted: [phone] Representative spoke with at DME Agency: Ian Malkin HH Arranged: OT, PT           Social Determinants of Health (SDOH) Interventions    Readmission Risk Interventions No flowsheet data found.

## 2019-09-17 NOTE — TOC Initial Note (Signed)
Transition of Care [facility]) - Initial/Assessment Note    Patient Details  Name: GISSEL KEILMAN MRN: [id number] Date of Birth: [birth year]  Transition of Care [facility]) CM/SW Contact:    Elease Hashimoto, LCSW Phone Number: 09/17/2019, 11:26 AM  Clinical Narrative:   Met with pt and husband who is at the bedside to discuss discharge needs. She reports her husband does the home management but she was able to do her own self care and mobility. She did not use any equipment prior to admission. Husband work third shift-[employer] but they have family and friends who can assist when he is working or sleeping. Discussed equipment needs does need rw but wants to think about 3 in 1. Trying to find a home health agency to take her [employer]. Will await responses from home health agencies. Husband provides the transportation to appointments and bill paying. Will await her decision regarding equipment and hopefully can wean off O2 she currently is on. Await return call from home health regarding accepting referral.               Expected Discharge Plan: [place] Barriers to Discharge: Continued Medical Work up   Patient Goals and CMS Choice Patient states their goals for this hospitalization and ongoing recovery are:: I hope to go home soon once I am able to move around [facility] Medicare.gov Compare Post Acute Care list provided to:: Patient Choice offered to / list presented to : Patient  Expected Discharge Plan and Services Expected Discharge Plan: [place] In-house Referral: Clinical Social Work   Post Acute Care Choice: Home Health, Durable Medical Equipment Living arrangements for the past 2 months: Apartment                 DME Arranged: Walker rolling DME Agency: [employer] Date DME Agency Contacted: 09/17/19 Time DME Agency Contacted: 33 Representative spoke with at DME Agency: [place] Arranged: OT, PT          Prior Living  Arrangements/Services Living arrangements for the past 2 months: Apartment Lives with:: Spouse Patient language and need for interpreter reviewed:: No Do you feel safe going back to the place where you live?: Yes      Need for Family Participation in Patient Care: Yes (Comment) Care giver support system in place?: Yes (comment)   Criminal Activity/Legal Involvement Pertinent to Current Situation/Hospitalization: No - Comment as needed  Activities of Daily Living Home Assistive Devices/Equipment: None ADL Screening (condition at time of admission) Patient's cognitive ability adequate to safely complete daily activities?: Yes Is the patient deaf or have difficulty hearing?: No Does the patient have difficulty seeing, even when wearing glasses/contacts?: No Does the patient have difficulty concentrating, remembering, or making decisions?: No Patient able to express need for assistance with ADLs?: Yes Does the patient have difficulty dressing or bathing?: No Independently performs ADLs?: Yes (appropriate for developmental age) Does the patient have difficulty walking or climbing stairs?: Yes Weakness of Legs: Left Weakness of Arms/Hands: None  Permission Sought/Granted Permission sought to share information with : Facility Sport and exercise psychologist, Family Supports Permission granted to share information with : Yes, Verbal Permission Granted  Share Information with NAME: Broadus John  Permission granted to share info w AGENCY: HOme health agency  Permission granted to share info w Relationship: husband     Emotional Assessment Appearance:: Appears older than stated age Attitude/Demeanor/Rapport: Engaged Affect (typically observed): Adaptable, Accepting Orientation: : Oriented to Self, Oriented to Place, Oriented  to  Time, Oriented to Situation   Psych Involvement: No (comment)  Admission diagnosis:  Fall, initial encounter [W19.XXXA] Closed nondisplaced intertrochanteric fracture of left  femur, initial encounter ([place]) [S72.145A] Closed fracture of femur, intertrochanteric, left, initial encounter [facility]) [S72.142A] Patient Active Problem List   Diagnosis Date Noted  . Fall at home, initial encounter 09/15/2019  . Preoperative clearance 09/15/2019  . Compression fracture of L2 [facility]) - age indeterminate 09/15/2019  . Closed fracture of femur, intertrochanteric, left, initial encounter ([place]) 09/15/2019  . History of TIA (transient ischemic attack) 09/15/2019  . HTN (hypertension) 09/15/2019  . Hyponatremia 09/15/2019  . MCI (mild cognitive impairment) 08/11/2019  . Diabetes mellitus type 2 with complications ([place]) 54/62/7035  . Chronic neck pain 09/20/2018  . Cervical radiculopathy 10/12/2014  . 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 Smoking 04/22/2013  . Coronary artery disease involving native coronary artery of native heart without angina pectoris 09/28/2010  . Hyperlipidemia 09/28/2010   PCP:  Kirk Ruths, MD Pharmacy:   [facility] [address] [place] [address] [place]) [place] [address] Phone: [phone] Fax: [phone]     Social Determinants of Health (SDOH) Interventions    Readmission Risk Interventions No flowsheet data found.

## 2019-09-17 NOTE — Progress Notes (Signed)
Physical Therapy Treatment Patient Details Name: ALEASE Huff MRN: [id number] DOB: 1960-05-18 Today's Date: 09/17/2019    History of Present Illness 59 year old female who presented to ED after fall, resulting in L femur fx.  Pt is s/p IM nailing on 09/16/19.  Pt's PMH includes CAD, L2 compression fx, DM2, mild cognitive impairment, hx of TIA, and hypertension.    PT Comments    PT was seated in recliner and reports has been up since AM session. She agrees to session but reports increased pain/sorness. RN notified and pt issued pain meds. She stood from recliner to RW with supervision, ambulated 150 ft with CGA for safety. Once returned to room, pt performed there ex handout. She did require min assist progressing LEs back into bed. Pt's family member in room throughout and will be home with pt at DC. PT recommends HHPT to follow at DC to continue to progress strength and safe functional mobility. At conclusion of session, pt was in bed with bed alarm in place, call bell in reach and RN in room.    Follow Up Recommendations  Home health PT;Follow surgeons recommendation for DC plan and follow-up therapies     Equipment Recommendations  Rolling walker with 5" wheels    Recommendations for Other Services       Precautions / Restrictions Precautions Precautions: Fall Restrictions Weight Bearing Restrictions: Yes LLE Weight Bearing: Weight bearing as tolerated    Mobility  Bed Mobility Overal bed mobility: Needs Assistance Bed Mobility: Sit to Supine       Sit to supine: Min assist   General bed mobility comments: Min assist progressing LEs into bed from short sit EOB  Transfers Overall transfer level: Needs assistance Equipment used: Rolling walker (2 wheeled) Transfers: Sit to/from Stand Sit to Stand: Supervision         General transfer comment: supervision for safety  Ambulation/Gait Ambulation/Gait assistance: Min guard Gait Distance (Feet): 150  Feet Assistive device: Rolling walker (2 wheeled)   Gait velocity: decreased   General Gait Details: pt was able to ambulate 150 ft with RW without LOB. does have step to antalgic gait pattern. cued for improved gait kinematics but unable to correct.   Stairs             Wheelchair Mobility    Modified Rankin (Stroke Patients Only)       Balance Overall balance assessment: Needs assistance Sitting-balance support: Feet supported;No upper extremity supported Sitting balance-Leahy Scale: Good Sitting balance - Comments: no LOB in sitting   Standing balance support: Bilateral upper extremity supported;During functional activity Standing balance-Leahy Scale: Good Standing balance comment: no LOB throughout session                            Cognition Arousal/Alertness: Awake/alert Behavior During Therapy: WFL for tasks assessed/performed Overall Cognitive Status: Within Functional Limits for tasks assessed                                 General Comments: Pt is A and O x 4 and agreeable and cooperative throughout. Reports increased soreness from AM session however did not limit PM session      Exercises General Exercises - Lower Extremity Ankle Circles/Pumps: AROM;10 reps Quad Sets: Strengthening;10 reps Heel Slides: Strengthening;10 reps Hip ABduction/ADduction: Strengthening;10 reps Straight Leg Raises: AAROM;10 reps;Supine    General Comments  Pertinent Vitals/Pain Pain Assessment: 0-10 Pain Score: 6  Faces Pain Scale: Hurts little more Pain Location: L hip Pain Descriptors / Indicators: Guarding;Discomfort;Grimacing Pain Intervention(s): Limited activity within patient's tolerance;Monitored during session;Premedicated before session;Repositioned    Home Living                      Prior Function            PT Goals (current goals can now be found in the care plan section) Acute Rehab PT Goals Patient Stated  Goal: to go home Progress towards PT goals: Progressing toward goals    Frequency    BID      PT Plan Current plan remains appropriate    Co-evaluation              AM-PAC PT "6 Clicks" Mobility   Outcome Measure  Help needed turning from your back to your side while in a flat bed without using bedrails?: None Help needed moving from lying on your back to sitting on the side of a flat bed without using bedrails?: None Help needed moving to and from a bed to a chair (including a wheelchair)?: None Help needed standing up from a chair using your arms (e.g., wheelchair or bedside chair)?: A Little Help needed to walk in hospital room?: A Little Help needed climbing 3-5 steps with a railing? : A Little 6 Click Score: 21    End of Session Equipment Utilized During Treatment: Gait belt Activity Tolerance: Patient tolerated treatment well Patient left: in bed;with call bell/phone within reach;with bed alarm set;with nursing/sitter in room;with family/visitor present Nurse Communication: Mobility status PT Visit Diagnosis: Muscle weakness (generalized) (M62.81);Difficulty in walking, not elsewhere classified (R26.2)     Time: 7517-0017 PT Time Calculation (min) (ACUTE ONLY): 23 min  Charges:  $Gait Training: 8-22 mins $Therapeutic Exercise: 8-22 mins                     Jetta Lout PTA 09/17/19, 5:05 PM

## 2019-09-17 NOTE — Progress Notes (Signed)
MD notified about the pt's BP of 81/46mmHg with MAP of 68. No intervention done as long as MAP is >65 per MD.

## 2019-09-18 LAB — BASIC METABOLIC PANEL
Anion gap: 6 (ref 5–15)
BUN: <5 mg/dL — ABNORMAL LOW (ref 8–23)
CO2: 25 mmol/L (ref 22–32)
Calcium: 7.9 mg/dL — ABNORMAL LOW (ref 8.9–10.3)
Chloride: 103 mmol/L (ref 98–111)
Creatinine, Ser: 0.54 mg/dL (ref 0.44–1.00)
GFR calc Af Amer: >60 mL/min (ref >60)
GFR calc non Af Amer: >60 mL/min (ref >60)
Glucose, Bld: 106 mg/dL — ABNORMAL HIGH (ref 70–99)
Potassium: 3.5 mmol/L (ref 3.5–5.1)
Sodium: 134 mmol/L — ABNORMAL LOW (ref 135–145)

## 2019-09-18 LAB — CBC
HCT: 34.6 % — ABNORMAL LOW (ref 36.0–46.0)
Hemoglobin: 11.9 g/dL — ABNORMAL LOW (ref 12.0–15.0)
MCH: 33 pg (ref 26.0–34.0)
MCHC: 34.4 g/dL (ref 30.0–36.0)
MCV: 95.8 fL (ref 80.0–100.0)
Platelets: 210 10*3/uL (ref 150–400)
RBC: 3.61 MIL/uL — ABNORMAL LOW (ref 3.87–5.11)
RDW: 12.1 % (ref 11.5–15.5)
WBC: 6.8 10*3/uL (ref 4.0–10.5)
nRBC: 0 % (ref 0.0–0.2)

## 2019-09-18 LAB — GLUCOSE, CAPILLARY
Glucose-Capillary: 101 mg/dL — ABNORMAL HIGH (ref 70–99)
Glucose-Capillary: 103 mg/dL — ABNORMAL HIGH (ref 70–99)
Glucose-Capillary: 104 mg/dL — ABNORMAL HIGH (ref 70–99)
Glucose-Capillary: 105 mg/dL — ABNORMAL HIGH (ref 70–99)
Glucose-Capillary: 111 mg/dL — ABNORMAL HIGH (ref 70–99)
Glucose-Capillary: 131 mg/dL — ABNORMAL HIGH (ref 70–99)
Glucose-Capillary: 89 mg/dL (ref 70–99)

## 2019-09-18 MED ORDER — GABAPENTIN 100 MG PO CAPS
100.0000 mg | ORAL_CAPSULE | Freq: Three times a day (TID) | ORAL | Status: DC
  Administered 2019-09-18 – 2019-09-20 (×6): 100 mg via ORAL
  Filled 2019-09-18 (×6): qty 1

## 2019-09-18 NOTE — Evaluation (Signed)
Physical Therapy Evaluation Patient Details Name: Annette Huff MRN: [id number] DOB: Nov 05, 1960 Today's Date: 09/18/2019   History of Present Illness  59 year old female who presented to ED after fall, resulting in L femur fx.  Pt is s/p IM nailing on 09/16/19.  Pt's PMH includes CAD, L2 compression fx, DM2, mild cognitive impairment, hx of TIA, and hypertension.  Clinical Impression  Attempted to see her earlier this PM, pt with a lot of pain and organized meds with nursing.  Pt still hurting but feeling much better at time of PM session.  She was able to ambulate ~150 ft, showed good mobility without needing direct assist and though she needed AAROM with a few exercises she was able to do AROM or some resisted acts with each of them by the final reps.  Pt was able to do steps this AM w/o physical assist, would probably benefit from another stair negotiation trial/review before heading home.      Follow Up Recommendations Home health PT;Follow surgeon's recommendation for DC plan and follow-up therapies    Equipment Recommendations  Rolling walker with 5" wheels    Recommendations for Other Services       Precautions / Restrictions Precautions Precautions: Fall Restrictions LLE Weight Bearing: Weight bearing as tolerated      Mobility  Bed Mobility Overal bed mobility: Needs Assistance Bed Mobility: Supine to Sit     Supine to sit: Supervision     General bed mobility comments: left assist with UEs getting R LE into bed from short sit EOB  Transfers Overall transfer level: Modified independent Equipment used: Rolling walker (2 wheeled) Transfers: Sit to/from Stand Sit to Stand: Supervision            Ambulation/Gait Ambulation/Gait assistance: Min guard Gait Distance (Feet): 150 Feet Assistive device: Rolling walker (2 wheeled)       General Gait Details: Pt with some very hesitant steps initially with minimal WBing but over the course of the ambulation bout she  was able to gradually increase WBing and consistency of cadence with less dependent on walker as we progressed  Careers information officer    Modified Rankin (Stroke Patients Only)       Balance Overall balance assessment: Needs assistance   Sitting balance-Leahy Scale: Good     Standing balance support: Bilateral upper extremity supported;During functional activity Standing balance-Leahy Scale: Good                               Pertinent Vitals/Pain Pain Assessment: 0-10 Pain Score: 6  Pain Location: again reports soreness is more of an issue than actual pain    Home Living                        Prior Function                 Hand Dominance        Extremity/Trunk Assessment                Communication      Cognition Arousal/Alertness: Awake/alert Behavior During Therapy: WFL for tasks assessed/performed Overall Cognitive Status: Within Functional Limits for tasks assessed  General Comments      Exercises General Exercises - Lower Extremity Ankle Circles/Pumps: AROM;10 reps Long Arc Quad: Strengthening;10 reps Heel Slides: Strengthening;10 reps (with resisted leg extensions) Hip ABduction/ADduction: Strengthening;10 reps Hip Flexion/Marching: AROM;AAROM;10 reps (needed assist on first few reps, AROM after that)   Assessment/Plan    PT Assessment    PT Problem List         PT Treatment Interventions      PT Goals (Current goals can be found in the Care Plan section)       Frequency BID   Barriers to discharge        Co-evaluation               AM-PAC PT "6 Clicks" Mobility  Outcome Measure Help needed turning from your back to your side while in a flat bed without using bedrails?: None Help needed moving from lying on your back to sitting on the side of a flat bed without using bedrails?: None Help needed moving to and from a  bed to a chair (including a wheelchair)?: None Help needed standing up from a chair using your arms (e.g., wheelchair or bedside chair)?: A Little Help needed to walk in hospital room?: A Little Help needed climbing 3-5 steps with a railing? : A Little 6 Click Score: 21    End of Session Equipment Utilized During Treatment: Gait belt Activity Tolerance: Patient tolerated treatment well Patient left: with call bell/phone within reach;with family/visitor present;with chair alarm set Nurse Communication: Mobility status PT Visit Diagnosis: Muscle weakness (generalized) (M62.81);Difficulty in walking, not elsewhere classified (R26.2)    Time: 4166-0630 PT Time Calculation (min) (ACUTE ONLY): 24 min   Charges:     PT Treatments $Gait Training: 8-22 mins $Therapeutic Exercise: 8-22 mins        Malachi Pro, DPT 09/18/2019, 5:04 PM

## 2019-09-18 NOTE — Progress Notes (Signed)
PROGRESS NOTE    Annette Huff  XVQ:[id number] DOB: [birth year] DOA: 09/15/2019 PCP: Lauro Regulus, MD  Assessment & Plan:   Principal Problem:   Closed fracture of femur, intertrochanteric, left, initial encounter [facility]) Active Problems:   Coronary artery disease involving native coronary artery of native heart without angina pectoris   Fall at home, initial encounter   Preoperative clearance   Compression fracture of L2 (HCC) - age indeterminate   Diabetes mellitus type 2 with complications (HCC)   MCI (mild cognitive impairment)   History of TIA (transient ischemic attack)   HTN (hypertension)   Hyponatremia   Left closed fracture of femur: secondary to fall at home. Still w/ significant pain today which awakens pt in the middle of the night. Continue w/ dilaudid & norco. CT head with no acute intracranial findings. S/p intramedullary fixation on 09/16/19. Ortho surg recs apprec. PT/OT recs home health.   Hypoxia: occurred during surgery. Weaned off of supplemental oxygen. CXR was normal. Normal WBC. Resolved   Depression: severity unknown. Will continue lexapro   Hyponatremia: resolved  CAD: w/ hx of stent. Not on aspirin, statin or BB at home.   Compression fracture of L2: age indeterminate. Unlikely related to fall  DM2: continue on SSI w/ accuchecks   Mild cognitive impairment: continue w/ neuro outpatient f/u   History of TIA: pt refused MRI on admission. Not on statin or aspirin      DVT prophylaxis: SCDs Code Status: full  Family Communication: discussed pt's care w/ pt's husband who is at bedside and answered his questions  Disposition Plan: likely d/c home w/ home health    Status is: Inpatient  Remains inpatient appropriate because: pain is not uncontrol, still requiring IV pain meds    Dispo: The patient is from: Home              Anticipated d/c is to: Home w/ [facility]               Anticipated d/c date is: 1 day              Patient currently  is not medically stable to d/c.    Consultants:   Ortho surg    Procedures:   Antimicrobials:    Subjective: Pt c/o left hip pain  Objective: Vitals:   09/17/19 0750 09/17/19 1142 09/17/19 1549 09/18/19 0001  BP: (!) 103/58 104/83 (!) 81/48 120/70  Pulse: 66 70 67 90  Resp: 16 16 16 16   Temp: 98.6 F (37 C) 98.1 F (36.7 C) 98.2 F (36.8 C) 98.7 F (37.1 C)  TempSrc: Oral Oral Oral   SpO2: 98% 98% 94% 90%  Weight:      Height:        Intake/Output Summary (Last 24 hours) at 09/18/2019 0731 Last data filed at 09/17/2019 2229 Gross per 24 hour  Intake 1044.25 ml  Output --  Net 1044.25 ml   Filed Weights   09/15/19 1801  Weight: 60.3 kg    Examination:  General exam: Appears calm and comfortable  Respiratory system: Clear to auscultation. Respiratory effort normal. No wheezes Cardiovascular system: S1 & S2 +. No  rubs, gallops or clicks. Gastrointestinal system: Abdomen is nondistended, soft and nontender. hypoactive bowel sounds heard. Central nervous system: Alert and oriented. Moves all 4 extremities  Psychiatry: Judgement and insight appear normal. Normal mood and affect.     Data Reviewed: I have personally reviewed following labs and imaging studies  CBC: Recent  Labs  Lab 09/15/19 1935 09/16/19 0011 09/17/19 0316 09/18/19 0313  WBC 9.7 7.9 5.9 6.8  NEUTROABS 8.1*  --   --   --   HGB 13.3 14.2 11.8* 11.9*  HCT 37.1 38.6 35.5* 34.6*  MCV 92.3 91.3 98.9 95.8  PLT 224 243 188 210   Basic Metabolic Panel: Recent Labs  Lab 09/15/19 1935 09/16/19 0817 09/16/19 1940 09/17/19 0316 09/18/19 0313  NA 126* 136 136 136 134*  K 3.6 3.5  --  3.7 3.5  CL 91* 99  --  104 103  CO2 23 26  --  29 25  GLUCOSE 135* 99  --  95 106*  BUN <5* <5*  --  <5* <5*  CREATININE 0.40* 0.57  --  0.43* 0.54  CALCIUM 8.1* 8.4*  --  7.8* 7.9*   GFR: Estimated Creatinine Clearance: 66.5 mL/min (by C-G formula based on SCr of 0.54 mg/dL). Liver Function  Tests: Recent Labs  Lab 09/15/19 1935  AST 22  ALT 17  ALKPHOS 65  BILITOT 0.5  PROT 6.6  ALBUMIN 3.5   No results for input(s): LIPASE, AMYLASE in the last 168 hours. No results for input(s): AMMONIA in the last 168 hours. Coagulation Profile: Recent Labs  Lab 09/16/19 0011  INR 0.9   Cardiac Enzymes: No results for input(s): CKTOTAL, CKMB, CKMBINDEX, TROPONINI in the last 168 hours. BNP (last 3 results) No results for input(s): PROBNP in the last 8760 hours. HbA1C: Recent Labs    09/15/19 1935  HGBA1C 6.0*   CBG: Recent Labs  Lab 09/17/19 1142 09/17/19 1631 09/17/19 1938 09/18/19 0003 09/18/19 0351  GLUCAP 111* 102* 102* 111* 101*   Lipid Profile: No results for input(s): CHOL, HDL, LDLCALC, TRIG, CHOLHDL, LDLDIRECT in the last 72 hours. Thyroid Function Tests: No results for input(s): TSH, T4TOTAL, FREET4, T3FREE, THYROIDAB in the last 72 hours. Anemia Panel: No results for input(s): VITAMINB12, FOLATE, FERRITIN, TIBC, IRON, RETICCTPCT in the last 72 hours. Sepsis Labs: No results for input(s): PROCALCITON, LATICACIDVEN in the last 168 hours.  Recent Results (from the past 240 hour(s))  SARS Coronavirus 2 by RT PCR (hospital order, performed in [facility] hospital lab) Nasopharyngeal Nasopharyngeal Swab     Status: None   Collection Time: 09/15/19  8:08 PM   Specimen: Nasopharyngeal Swab  Result Value Ref Range Status   SARS Coronavirus 2 NEGATIVE NEGATIVE Final    Comment: (NOTE) SARS-CoV-2 target nucleic acids are NOT DETECTED.  The SARS-CoV-2 RNA is generally detectable in upper and lower respiratory specimens during the acute phase of infection. The lowest concentration of SARS-CoV-2 viral copies this assay can detect is 250 copies / mL. A negative result does not preclude SARS-CoV-2 infection and should not be used as the sole basis for treatment or other patient management decisions.  A negative result may occur with improper specimen  collection / handling, submission of specimen other than nasopharyngeal swab, presence of viral mutation(s) within the areas targeted by this assay, and inadequate number of viral copies (<250 copies / mL). A negative result must be combined with clinical observations, patient history, and epidemiological information.  Fact Sheet for Patients:   BoilerBrush.com.cy  Fact Sheet for Healthcare Providers: https://pope.com/  This test is not yet approved or  cleared by the [place] FDA and has been authorized for detection and/or diagnosis of SARS-CoV-2 by FDA under an Emergency Use Authorization (EUA).  This EUA will remain in effect (meaning this test can be used) for  the duration of the COVID-19 declaration under Section 564(b)(1) of the Act, 21 U.S.C. section 360bbb-3(b)(1), unless the authorization is terminated or revoked sooner.  Performed at [facility], [address] [place], [place] [address]   Surgical pcr screen     Status: None   Collection Time: 09/15/19 10:42 PM   Specimen: Nasal Mucosa; Nasal Swab  Result Value Ref Range Status   MRSA, PCR NEGATIVE NEGATIVE Final   Staphylococcus aureus NEGATIVE NEGATIVE Final    Comment: (NOTE) The Xpert SA Assay (FDA approved for NASAL specimens in patients 78 years of age and older), is one component of a comprehensive surveillance program. It is not intended to diagnose infection nor to guide or monitor treatment. Performed at [facility], [address] [place], [place] [address]          Radiology Studies: DG HIP OPERATIVE UNILAT W OR W/O PELVIS LEFT  Result Date: 09/16/2019 CLINICAL DATA:  Left operative hip EXAM: OPERATIVE left HIP (WITH PELVIS IF PERFORMED)  VIEWS TECHNIQUE: Fluoroscopic spot image(s) were submitted for interpretation post-operatively. COMPARISON:  None. FINDINGS: Four intraop views were submitted for review of IM nail  fixation of left femur fracture. Fluoro time 1 minutes 51 seconds IMPRESSION: Status post ORIF of proximal left femur fracture Electronically Signed   By: Jonna Clark M.D.   On: 09/16/2019 17:20   DG FEMUR PORT MIN 2 VIEWS LEFT  Result Date: 09/16/2019 CLINICAL DATA:  Postop IM nail. EXAM: LEFT FEMUR PORTABLE 2 VIEWS COMPARISON:  Preoperative hip radiograph yesterday. FINDINGS: Intramedullary nail with distal locking and trans trochanteric screw fixation of intertrochanteric femur fracture. The fracture is in improved alignment compared to preoperative imaging. No new fracture or periprosthetic lucency. Recent postsurgical change includes air and edema in the soft tissues and lateral skin staples. IMPRESSION: ORIF of left intertrochanteric femur fracture. No immediate postoperative complication. Electronically Signed   By: Narda Rutherford M.D.   On: 09/16/2019 18:25        Scheduled Meds: . Chlorhexidine Gluconate Cloth  6 each Topical Daily  . docusate sodium  100 mg Oral BID  . enoxaparin (LOVENOX) injection  40 mg Subcutaneous Q24H  . escitalopram  10 mg Oral Q0600  . gabapentin  300 mg Oral TID  . insulin aspart  0-9 Units Subcutaneous Q4H  . multivitamin with minerals  1 tablet Oral Daily  . senna  1 tablet Oral BID   Continuous Infusions: . 0.45 % NaCl with KCl 20 mEq / L Stopped (09/17/19 1204)  . methocarbamol (ROBAXIN) IV       LOS: 3 days    Time spent: 32 mins    Charise Killian, MD [facility] Pager [phone]  If 7PM-7AM, please contact night-coverage [email or web address] 09/18/2019, 7:31 AM

## 2019-09-18 NOTE — Progress Notes (Signed)
Subjective:  POD #2 s/p intramedullary fixation for left intertrochanteric hip fracture.   Patient reports left lower extremity pain as mild to moderate.  Patient's husband is at the bedside.  Patient states she was up with physical therapy today and even practiced the stairs.  She feels more pain in the left lower extremity today.  Objective:   VITALS:   Vitals:   09/17/19 1142 09/17/19 1549 09/18/19 0001 09/18/19 0747  BP: 104/83 (!) 81/48 120/70 104/70  Pulse: 70 67 90 85  Resp: 16 16 16 16   Temp: 98.1 F (36.7 C) 98.2 F (36.8 C) 98.7 F (37.1 C) 99.1 F (37.3 C)  TempSrc: Oral Oral  Axillary  SpO2: 98% 94% 90% 94%  Weight:      Height:        PHYSICAL EXAM: Left lower extremity Neurovascular intact Sensation intact distally Intact pulses distally Dorsiflexion/Plantar flexion intact Incision: dressing C/D/I No cellulitis present Compartment soft  LABS  Results for orders placed or performed during the hospital encounter of 09/15/19 (from the past 24 hour(s))  Glucose, capillary     Status: Abnormal   Collection Time: 09/17/19  4:31 PM  Result Value Ref Range   Glucose-Capillary 102 (H) 70 - 99 mg/dL  Glucose, capillary     Status: Abnormal   Collection Time: 09/17/19  7:38 PM  Result Value Ref Range   Glucose-Capillary 102 (H) 70 - 99 mg/dL   Comment 1 Notify RN   Glucose, capillary     Status: Abnormal   Collection Time: 09/18/19 12:03 AM  Result Value Ref Range   Glucose-Capillary 111 (H) 70 - 99 mg/dL   Comment 1 Notify RN   CBC     Status: Abnormal   Collection Time: 09/18/19  3:13 AM  Result Value Ref Range   WBC 6.8 4.0 - 10.5 K/uL   RBC 3.61 (L) 3.87 - 5.11 MIL/uL   Hemoglobin 11.9 (L) 12.0 - 15.0 g/dL   HCT 09/20/19 (L) 36 - 46 %   MCV 95.8 80.0 - 100.0 fL   MCH 33.0 26.0 - 34.0 pg   MCHC 34.4 30.0 - 36.0 g/dL   RDW 35.7 01.7 - 79.3 %   Platelets 210 150 - 400 K/uL   nRBC 0.0 0.0 - 0.2 %  Basic metabolic panel     Status: Abnormal   Collection  Time: 09/18/19  3:13 AM  Result Value Ref Range   Sodium 134 (L) 135 - 145 mmol/L   Potassium 3.5 3.5 - 5.1 mmol/L   Chloride 103 98 - 111 mmol/L   CO2 25 22 - 32 mmol/L   Glucose, Bld 106 (H) 70 - 99 mg/dL   BUN <5 (L) 8 - 23 mg/dL   Creatinine, Ser 09/20/19 0.44 - 1.00 mg/dL   Calcium 7.9 (L) 8.9 - 10.3 mg/dL   GFR calc non Af Amer >60 >60 mL/min   GFR calc Af Amer >60 >60 mL/min   Anion gap 6 5 - 15  Glucose, capillary     Status: Abnormal   Collection Time: 09/18/19  3:51 AM  Result Value Ref Range   Glucose-Capillary 101 (H) 70 - 99 mg/dL   Comment 1 Notify RN   Glucose, capillary     Status: None   Collection Time: 09/18/19  7:49 AM  Result Value Ref Range   Glucose-Capillary 89 70 - 99 mg/dL   Comment 1 Notify RN    Comment 2 Document in Chart   Glucose, capillary  Status: Abnormal   Collection Time: 09/18/19 11:44 AM  Result Value Ref Range   Glucose-Capillary 104 (H) 70 - 99 mg/dL   Comment 1 Notify RN    Comment 2 Document in Chart     DG HIP OPERATIVE UNILAT W OR W/O PELVIS LEFT  Result Date: 09/16/2019 CLINICAL DATA:  Left operative hip EXAM: OPERATIVE left HIP (WITH PELVIS IF PERFORMED)  VIEWS TECHNIQUE: Fluoroscopic spot image(s) were submitted for interpretation post-operatively. COMPARISON:  None. FINDINGS: Four intraop views were submitted for review of IM nail fixation of left femur fracture. Fluoro time 1 minutes 51 seconds IMPRESSION: Status post ORIF of proximal left femur fracture Electronically Signed   By: Jonna Clark M.D.   On: 09/16/2019 17:20   DG FEMUR PORT MIN 2 VIEWS LEFT  Result Date: 09/16/2019 CLINICAL DATA:  Postop IM nail. EXAM: LEFT FEMUR PORTABLE 2 VIEWS COMPARISON:  Preoperative hip radiograph yesterday. FINDINGS: Intramedullary nail with distal locking and trans trochanteric screw fixation of intertrochanteric femur fracture. The fracture is in improved alignment compared to preoperative imaging. No new fracture or periprosthetic lucency.  Recent postsurgical change includes air and edema in the soft tissues and lateral skin staples. IMPRESSION: ORIF of left intertrochanteric femur fracture. No immediate postoperative complication. Electronically Signed   By: Narda Rutherford M.D.   On: 09/16/2019 18:25    Assessment/Plan: 2 Days Post-Op   Principal Problem:   Closed fracture of femur, intertrochanteric, left, initial encounter [facility]) Active Problems:   Coronary artery disease involving native coronary artery of native heart without angina pectoris   Fall at home, initial encounter   Preoperative clearance   Compression fracture of L2 (HCC) - age indeterminate   Diabetes mellitus type 2 with complications (HCC)   MCI (mild cognitive impairment)   History of TIA (transient ischemic attack)   HTN (hypertension)   Hyponatremia  Patient is stable postop.  She is making progress with physical therapy.  Patient should continue Lovenox 40 mg daily for DVT prophylaxis x2 weeks until follow-up in the office with Dr. Martha Clan.  Patient should follow-up at [facility] in 10 to 14 days for wound check, staple removal and x-ray.  Patient is weightbearing as tolerated in the left lower extremity.   Juanell Fairly , MD 09/18/2019, 2:49 PM

## 2019-09-19 DIAGNOSIS — R4189 Other symptoms and signs involving cognitive functions and awareness: Secondary | ICD-10-CM

## 2019-09-19 LAB — BASIC METABOLIC PANEL
Anion gap: 10 (ref 5–15)
BUN: <5 mg/dL — ABNORMAL LOW (ref 8–23)
CO2: 28 mmol/L (ref 22–32)
Calcium: 8.3 mg/dL — ABNORMAL LOW (ref 8.9–10.3)
Chloride: 100 mmol/L (ref 98–111)
Creatinine, Ser: 0.38 mg/dL — ABNORMAL LOW (ref 0.44–1.00)
GFR calc Af Amer: >60 mL/min (ref >60)
GFR calc non Af Amer: >60 mL/min (ref >60)
Glucose, Bld: 94 mg/dL (ref 70–99)
Potassium: 3.6 mmol/L (ref 3.5–5.1)
Sodium: 138 mmol/L (ref 135–145)

## 2019-09-19 LAB — GLUCOSE, CAPILLARY
Glucose-Capillary: 97 mg/dL (ref 70–99)
Glucose-Capillary: 91 mg/dL (ref 70–99)
Glucose-Capillary: 101 mg/dL — ABNORMAL HIGH (ref 70–99)
Glucose-Capillary: 101 mg/dL — ABNORMAL HIGH (ref 70–99)
Glucose-Capillary: 99 mg/dL (ref 70–99)

## 2019-09-19 LAB — CBC
HCT: 31.9 % — ABNORMAL LOW (ref 36.0–46.0)
Hemoglobin: 10.9 g/dL — ABNORMAL LOW (ref 12.0–15.0)
MCH: 33.2 pg (ref 26.0–34.0)
MCHC: 34.2 g/dL (ref 30.0–36.0)
MCV: 97.3 fL (ref 80.0–100.0)
Platelets: 219 10*3/uL (ref 150–400)
RBC: 3.28 MIL/uL — ABNORMAL LOW (ref 3.87–5.11)
RDW: 12.3 % (ref 11.5–15.5)
WBC: 6.4 10*3/uL (ref 4.0–10.5)
nRBC: 0 % (ref 0.0–0.2)

## 2019-09-19 NOTE — Progress Notes (Signed)
Physical Therapy Treatment Patient Details Name: Annette Huff MRN: [id number] DOB: Oct 15, 1960 Today's Date: 09/19/2019    History of Present Illness 59 year old female who presented to ED after fall, resulting in L femur fx.  Pt is s/p IM nailing on 09/16/19.  Pt's PMH includes CAD, L2 compression fx, DM2, mild cognitive impairment, hx of TIA, and hypertension.    PT Comments    Patient tolerated treatment well and continues to make good progress towards goals. Was able to ambulate further with improved gait pattern and was more confident with stairs. Did not need physical assist throughout session today. Patient would benefit from continued skilled physical therapy to address remaining impairments and functional limitations to work towards stated goals and return to PLOF or maximal functional independence.       Follow Up Recommendations  Home health PT;Follow surgeons recommendation for DC plan and follow-up therapies     Equipment Recommendations  Rolling walker with 5" wheels    Recommendations for Other Services       Precautions / Restrictions Precautions Precautions: Fall Restrictions Weight Bearing Restrictions: Yes LLE Weight Bearing: Weight bearing as tolerated    Mobility  Bed Mobility               General bed mobility comments: did not perform bed mobility this session. patient already in chair  Transfers Overall transfer level: Modified independent Equipment used: Rolling walker (2 wheeled) Transfers: Sit to/from Stand Sit to Stand: Supervision         General transfer comment: demo good carry over in use of hands and positioning. Did need cuing to touch legs to chair prior to sit. Performed sit <> stand from/to chair x 5 without physical assist.  Ambulation/Gait Ambulation/Gait assistance: Min guard;Supervision Gait Distance (Feet): 215 Feet Assistive device: Rolling walker (2 wheeled) Gait Pattern/deviations: Step-to pattern;Step-through  pattern;Decreased step length - right;Decreased stance time - left Gait velocity: decreased   General Gait Details: Started with step together gait, but then progressed to step through gait pattern by end of ambulation with decreased reliance on RW. Also ambulated another 10 feet including side stepping to get through narrow area at foot of bed. Required minimal cuing for walker position. Required encuragement to work towards more normal gait pattern.   Stairs Stairs: Yes Stairs assistance: Min guard Stair Management: Two rails;Forwards;Step to pattern Number of Stairs: 4 General stair comments: Patient hesitant at first but was able to negotiate stairs without phisical assist. Good carry over for which foot to lead with. Educated again on purpose of leading with surgical side down.   Wheelchair Mobility    Modified Rankin (Stroke Patients Only)       Balance Overall balance assessment: Needs assistance   Sitting balance-Leahy Scale: Good Sitting balance - Comments: steady sitting at edge of chair.   Standing balance support: Bilateral upper extremity supported;During functional activity Standing balance-Leahy Scale: Good Standing balance comment: reliant on walker, hesitant with full L WBing but ultimately did not have overt balance/safety issues                            Cognition Arousal/Alertness: Awake/alert Behavior During Therapy: WFL for tasks assessed/performed Overall Cognitive Status: Within Functional Limits for tasks assessed  Exercises General Exercises - Lower Extremity Ankle Circles/Pumps: Seated;AROM;Both;20 reps;Strengthening (feet on floor) Long Arc Quad: Strengthening;Seated;Left;20 reps;AROM (3 second hold, 2x10) Hip Flexion/Marching: Seated;AROM;Both;20 reps (2x10 alternating marching in seated)    General Comments General comments (skin integrity, edema, etc.): Vitals remained WFL  throughout session and patient was asymptomatic      Pertinent Vitals/Pain Pain Assessment: Faces Faces Pain Scale: Hurts a little bit Pain Location: pain in L hip when first starting exercise or weight bearing Pain Descriptors / Indicators: Guarding;Discomfort;Grimacing Pain Intervention(s): Limited activity within patient's tolerance;Monitored during session;Repositioned    Home Living                      Prior Function            PT Goals (current goals can now be found in the care plan section) Acute Rehab PT Goals Patient Stated Goal: to go home PT Goal Formulation: With patient/family Time For Goal Achievement: 10/01/19 Potential to Achieve Goals: Good Progress towards PT goals: Progressing toward goals    Frequency    BID      PT Plan Current plan remains appropriate    Co-evaluation              AM-PAC PT "6 Clicks" Mobility   Outcome Measure  Help needed turning from your back to your side while in a flat bed without using bedrails?: None Help needed moving from lying on your back to sitting on the side of a flat bed without using bedrails?: None Help needed moving to and from a bed to a chair (including a wheelchair)?: None Help needed standing up from a chair using your arms (e.g., wheelchair or bedside chair)?: A Little Help needed to walk in hospital room?: A Little Help needed climbing 3-5 steps with a railing? : A Little 6 Click Score: 21    End of Session Equipment Utilized During Treatment: Gait belt Activity Tolerance: Patient tolerated treatment well Patient left: with call bell/phone within reach;with family/visitor present;with chair alarm set;in chair Nurse Communication: Mobility status PT Visit Diagnosis: Muscle weakness (generalized) (M62.81);Difficulty in walking, not elsewhere classified (R26.2)     Time: 1110-1145 PT Time Calculation (min) (ACUTE ONLY): 35 min  Charges:  $Gait Training: 8-22 mins $Therapeutic  Exercise: 8-22 mins                    Luretha Murphy. Ilsa Iha, PT, DPT 09/19/19, 12:03 PM

## 2019-09-19 NOTE — Progress Notes (Signed)
Subjective: 3 Days Post-Op Procedure(s) (LRB): INTRAMEDULLARY (IM) NAIL INTERTROCHANTRIC (Left)   Patient is out of bed in a chair.  She is alert and fully oriented.  Exhibits very little pain.  She had a slight fever none discharge has been held up by that.  Lab work is normal. Plans to go home tomorrow hopefully.  Patient reports pain as mild.  Objective:   VITALS:   Vitals:   09/18/19 2336 09/19/19 0735  BP: 131/61 133/90  Pulse: 89 86  Resp: 18 16  Temp: 100.2 F (37.9 C) 99.1 F (37.3 C)  SpO2: 94% 93%    Neurologically intact Neurovascular intact Sensation intact distally Intact pulses distally Dorsiflexion/Plantar flexion intact Incision: dressing C/D/I  LABS Recent Labs    09/17/19 0316 09/18/19 0313 09/19/19 0355  HGB 11.8* 11.9* 10.9*  HCT 35.5* 34.6* 31.9*  WBC 5.9 6.8 6.4  PLT 188 210 219    Recent Labs    09/17/19 0316 09/18/19 0313 09/19/19 0355  NA 136 134* 138  K 3.7 3.5 3.6  BUN <5* <5* <5*  CREATININE 0.43* 0.54 0.38*  GLUCOSE 95 106* 94    No results for input(s): LABPT, INR in the last 72 hours.   Assessment/Plan: 3 Days Post-Op Procedure(s) (LRB): INTRAMEDULLARY (IM) NAIL INTERTROCHANTRIC (Left)   Advance diet Up with therapy Discharge home with home health   Discharge on Lovenox 40 mg daily for 2 weeks  Return to clinic to see Dr. Martha Clan in 2 weeks.

## 2019-09-19 NOTE — Progress Notes (Signed)
Patient up in recliner , pain well controlled this shift with oxy 10mg  before PT . Patient had BM and has went up the stairs. Patient states she feels better today . Continent of bowel and bladder. Able to make needs known. Call bell and hydration within reach. Will continue to monitor throughout shift.

## 2019-09-19 NOTE — Progress Notes (Signed)
Physical Therapy Treatment Patient Details Name: Annette Huff MRN: [id number] DOB: 09/15/1960 Today's Date: 09/19/2019    History of Present Illness 59 year old female who presented to ED after fall, resulting in L femur fx.  Pt is s/p IM nailing on 09/16/19.  Pt's PMH includes CAD, L2 compression fx, DM2, mild cognitive impairment, hx of TIA, and hypertension.    PT Comments    Patient tolerated treatment well overall and continues to make progress towards goals. Patient is demonstrating good positioning and use of RW consistently. She ambulated further than last session and continues to improve her gait pattern. Did have some decreased SpO2 to 88% with shortness of breath at 200 feet of ambulation but recovered with standing rest and PLB and able to continue ambulating back to room. Able to perform L active straight leg raise this session in supine but continues to have difficulty lifting L LE without assistance from UEs. Patient would benefit from continued skilled physical therapy to address remaining impairments and functional limitations to work towards stated goals and return to PLOF or maximal functional independence.      Follow Up Recommendations  Home health PT;Follow surgeon's recommendation for DC plan and follow-up therapies     Equipment Recommendations  Rolling walker with 5" wheels    Recommendations for Other Services       Precautions / Restrictions Precautions Precautions: Fall Restrictions Weight Bearing Restrictions: Yes LLE Weight Bearing: Weight bearing as tolerated    Mobility  Bed Mobility Overal bed mobility: Needs Assistance Bed Mobility: Sit to Supine       Sit to supine: Supervision;HOB elevated   General bed mobility comments: HOB slightly elevated. Pt uses UE to assist L LE into bed. Able to get into bed and position herself.  Transfers Overall transfer level: Needs assistance Equipment used: Rolling walker (2 wheeled) Transfers: Sit  to/from Stand Sit to Stand: Supervision         General transfer comment: completes sit <> stand transfers with supervision. Has some pain when sitting to low bed.  Ambulation/Gait Ambulation/Gait assistance: Supervision Gait Distance (Feet): 270 Feet Assistive device: Rolling walker (2 wheeled) Gait Pattern/deviations: Step-to pattern;Step-through pattern;Decreased step length - right;Decreased stance time - left Gait velocity: decreased   General Gait Details: Started with small steps with R LE progressing to step through gait pattern with continuous rolling of RW. Did require a standing break at around 200 feet due to shortness of breath. SpO2 down to 88% on RA and HR 107 at that point. Performed PLB and continued when SpO2 recovered to 94% and HR below 100 bpm.   Stairs         General stair comments: Did not practice stairs this session. Patient reports she feels comfortable with getting into her home safely after practicing twice before.   Wheelchair Mobility    Modified Rankin (Stroke Patients Only)       Balance Overall balance assessment: Needs assistance   Sitting balance-Leahy Scale: Good Sitting balance - Comments: steady sitting at edge of chair.   Standing balance support: Bilateral upper extremity supported;During functional activity Standing balance-Leahy Scale: Good Standing balance comment: reliant on walker, hesitant with full L WBing but ultimately did not have overt balance/safety issues                            Cognition Arousal/Alertness: Awake/alert Behavior During Therapy: WFL for tasks assessed/performed Overall Cognitive Status: Within Functional  Limits for tasks assessed                                        Exercises General Exercises - Lower Extremity Heel Slides: Supine;AROM;Left;Strengthening;10 reps Hip ABduction/ADduction: Supine;AROM;Left;20 reps;Strengthening (2x10) Straight Leg Raises:  Supine;AROM;Left;10 reps;Strengthening (2x5) Other Exercises Other Exercises: hooklying glute bridges, 2x10    General Comments General comments (skin integrity, edema, etc.): SpO2 dropped to 88% and pt reported SOB at 200 feet of ambulation. Resolved with standing rest and PLB and able to continue ambulating.      Pertinent Vitals/Pain Pain Assessment: Faces Faces Pain Scale: Hurts little more Pain Location: when initially weight bearing or flexing hip. more of a soreness Pain Descriptors / Indicators: Guarding;Discomfort;Grimacing Pain Intervention(s): Limited activity within patient's tolerance;Monitored during session;Premedicated before session;Repositioned    Home Living                      Prior Function            PT Goals (current goals can now be found in the care plan section) Acute Rehab PT Goals Patient Stated Goal: to go home PT Goal Formulation: With patient/family Time For Goal Achievement: 10/01/19 Potential to Achieve Goals: Good Progress towards PT goals: Progressing toward goals    Frequency    BID      PT Plan Current plan remains appropriate    Co-evaluation              AM-PAC PT "6 Clicks" Mobility   Outcome Measure  Help needed turning from your back to your side while in a flat bed without using bedrails?: None Help needed moving from lying on your back to sitting on the side of a flat bed without using bedrails?: None Help needed moving to and from a bed to a chair (including a wheelchair)?: None Help needed standing up from a chair using your arms (e.g., wheelchair or bedside chair)?: None Help needed to walk in hospital room?: A Little Help needed climbing 3-5 steps with a railing? : A Little 6 Click Score: 22    End of Session Equipment Utilized During Treatment: Gait belt Activity Tolerance: Patient tolerated treatment well Patient left: with call bell/phone within reach;with family/visitor present;in bed Nurse  Communication: Mobility status PT Visit Diagnosis: Muscle weakness (generalized) (M62.81);Difficulty in walking, not elsewhere classified (R26.2)     Time: 4854-6270 PT Time Calculation (min) (ACUTE ONLY): 24 min  Charges:  $Gait Training: 8-22 mins $Therapeutic Exercise: 8-22 mins                    Luretha Murphy. Ilsa Iha, PT, DPT 09/19/19, 4:26 PM

## 2019-09-19 NOTE — Progress Notes (Signed)
PROGRESS NOTE    Annette Huff  XAJ:[id number] DOB: 1960/10/01 DOA: 09/15/2019 PCP: Lauro Regulus, MD  Assessment & Plan:   Principal Problem:   Closed fracture of femur, intertrochanteric, left, initial encounter [facility]) Active Problems:   Coronary artery disease involving native coronary artery of native heart without angina pectoris   Fall at home, initial encounter   Preoperative clearance   Compression fracture of L2 (HCC) - age indeterminate   Diabetes mellitus type 2 with complications (HCC)   MCI (mild cognitive impairment)   History of TIA (transient ischemic attack)   HTN (hypertension)   Hyponatremia   Left closed fracture of femur: secondary to fall at home. Slightly improved pain today. Continue w/ dilaudid & norco. CT head with no acute intracranial findings. S/p intramedullary fixation on 09/16/19. Ortho surg recs apprec. PT/OT recs home health.   Fever: etiology unclear. Normal WBC. Likely post-op. Will hold off on abx for now. Will d/c home once afebrile for 24 hours   Hypoxia: occurred during surgery. Weaned off of supplemental oxygen. CXR was normal. Normal WBC. Resolved   Depression: severity unknown. Will continue lexapro   Hyponatremia: resolved  CAD: w/ hx of stent. Not on aspirin, statin or BB at home.   Compression fracture of L2: age indeterminate. Unlikely related to fall  DM2: continue on SSI w/ accuchecks   Mild cognitive impairment: continue w/ neuro outpatient f/u   History of TIA: pt refused MRI on admission. Not on statin or aspirin      DVT prophylaxis: lovenox Code Status: full  Family Communication:  Disposition Plan: likely d/c home w/ home health    Status is: Inpatient  Remains inpatient appropriate because: Febrile overnight   Dispo: The patient is from: Home              Anticipated d/c is to: Home w/ [facility]               Anticipated d/c date is: 1 day              Patient currently is not medically stable to  d/c.    Consultants:   Ortho surg    Procedures:   Antimicrobials:    Subjective: Pt c/o left hip pain especially w/ walking  Objective: Vitals:   09/18/19 0001 09/18/19 0747 09/18/19 1531 09/18/19 2336  BP: 120/70 104/70 105/73 131/61  Pulse: 90 85 79 89  Resp: 16 16 16 18   Temp: 98.7 F (37.1 C) 99.1 F (37.3 C) 99.1 F (37.3 C) 100.2 F (37.9 C)  TempSrc:  Axillary Oral Oral  SpO2: 90% 94% 96% 94%  Weight:      Height:        Intake/Output Summary (Last 24 hours) at 09/19/2019 0724 Last data filed at 09/18/2019 0920 Gross per 24 hour  Intake 480 ml  Output --  Net 480 ml   Filed Weights   09/15/19 1801  Weight: 60.3 kg    Examination:  General exam: Appears calm and comfortable  Respiratory system: Clear to auscultation. Respiratory effort normal. No rales, rhonchi Cardiovascular system: S1 & S2 +. No  rubs, gallops or clicks. Gastrointestinal system: Abdomen is nondistended, soft and nontender. Normal bowel sounds heard. Central nervous system: Alert and oriented. Moves all 4 extremities  Psychiatry: Judgement and insight appear normal. Normal mood and affect.     Data Reviewed: I have personally reviewed following labs and imaging studies  CBC: Recent Labs  Lab 09/15/19 1935 09/16/19  0011 09/17/19 0316 09/18/19 0313 09/19/19 0355  WBC 9.7 7.9 5.9 6.8 6.4  NEUTROABS 8.1*  --   --   --   --   HGB 13.3 14.2 11.8* 11.9* 10.9*  HCT 37.1 38.6 35.5* 34.6* 31.9*  MCV 92.3 91.3 98.9 95.8 97.3  PLT 224 243 188 210 219   Basic Metabolic Panel: Recent Labs  Lab 09/15/19 1935 09/15/19 1935 09/16/19 0817 09/16/19 1940 09/17/19 0316 09/18/19 0313 09/19/19 0355  NA 126*   < > 136 136 136 134* 138  K 3.6  --  3.5  --  3.7 3.5 3.6  CL 91*  --  99  --  104 103 100  CO2 23  --  26  --  29 25 28   GLUCOSE 135*  --  99  --  95 106* 94  BUN <5*  --  <5*  --  <5* <5* <5*  CREATININE 0.40*  --  0.57  --  0.43* 0.54 0.38*  CALCIUM 8.1*  --  8.4*  --   7.8* 7.9* 8.3*   < > = values in this interval not displayed.   GFR: Estimated Creatinine Clearance: 66.5 mL/min (A) (by C-G formula based on SCr of 0.38 mg/dL (L)). Liver Function Tests: Recent Labs  Lab 09/15/19 1935  AST 22  ALT 17  ALKPHOS 65  BILITOT 0.5  PROT 6.6  ALBUMIN 3.5   No results for input(s): LIPASE, AMYLASE in the last 168 hours. No results for input(s): AMMONIA in the last 168 hours. Coagulation Profile: Recent Labs  Lab 09/16/19 0011  INR 0.9   Cardiac Enzymes: No results for input(s): CKTOTAL, CKMB, CKMBINDEX, TROPONINI in the last 168 hours. BNP (last 3 results) No results for input(s): PROBNP in the last 8760 hours. HbA1C: No results for input(s): HGBA1C in the last 72 hours. CBG: Recent Labs  Lab 09/18/19 1144 09/18/19 1640 09/18/19 2030 09/18/19 2338 09/19/19 0415  GLUCAP 104* 103* 131* 105* 97   Lipid Profile: No results for input(s): CHOL, HDL, LDLCALC, TRIG, CHOLHDL, LDLDIRECT in the last 72 hours. Thyroid Function Tests: No results for input(s): TSH, T4TOTAL, FREET4, T3FREE, THYROIDAB in the last 72 hours. Anemia Panel: No results for input(s): VITAMINB12, FOLATE, FERRITIN, TIBC, IRON, RETICCTPCT in the last 72 hours. Sepsis Labs: No results for input(s): PROCALCITON, LATICACIDVEN in the last 168 hours.  Recent Results (from the past 240 hour(s))  SARS Coronavirus 2 by RT PCR (hospital order, performed in [facility] hospital lab) Nasopharyngeal Nasopharyngeal Swab     Status: None   Collection Time: 09/15/19  8:08 PM   Specimen: Nasopharyngeal Swab  Result Value Ref Range Status   SARS Coronavirus 2 NEGATIVE NEGATIVE Final    Comment: (NOTE) SARS-CoV-2 target nucleic acids are NOT DETECTED.  The SARS-CoV-2 RNA is generally detectable in upper and lower respiratory specimens during the acute phase of infection. The lowest concentration of SARS-CoV-2 viral copies this assay can detect is 250 copies / mL. A negative result does  not preclude SARS-CoV-2 infection and should not be used as the sole basis for treatment or other patient management decisions.  A negative result may occur with improper specimen collection / handling, submission of specimen other than nasopharyngeal swab, presence of viral mutation(s) within the areas targeted by this assay, and inadequate number of viral copies (<250 copies / mL). A negative result must be combined with clinical observations, patient history, and epidemiological information.  Fact Sheet for Patients:   09/17/19  Fact  Sheet for Healthcare Providers: https://pope.com/  This test is not yet approved or  cleared by the [place] and has been authorized for detection and/or diagnosis of SARS-CoV-2 by FDA under an Emergency Use Authorization (EUA).  This EUA will remain in effect (meaning this test can be used) for the duration of the COVID-19 declaration under Section 564(b)(1) of the Act, 21 U.S.C. section 360bbb-3(b)(1), unless the authorization is terminated or revoked sooner.  Performed at [facility], [address], [place], [place] [address]   Surgical pcr screen     Status: None   Collection Time: 09/15/19 10:42 PM   Specimen: Nasal Mucosa; Nasal Swab  Result Value Ref Range Status   MRSA, PCR NEGATIVE NEGATIVE Final   Staphylococcus aureus NEGATIVE NEGATIVE Final    Comment: (NOTE) The Xpert SA Assay (FDA approved for NASAL specimens in patients 64 years of age and older), is one component of a comprehensive surveillance program. It is not intended to diagnose infection nor to guide or monitor treatment. Performed at [facility], [address] [place], [place] [address]          Radiology Studies: No results found.      Scheduled Meds: . Chlorhexidine Gluconate Cloth  6 each Topical Daily  . docusate sodium  100 mg Oral BID  . enoxaparin (LOVENOX)  injection  40 mg Subcutaneous Q24H  . escitalopram  10 mg Oral Q0600  . gabapentin  100 mg Oral TID  . insulin aspart  0-9 Units Subcutaneous Q4H  . multivitamin with minerals  1 tablet Oral Daily  . senna  1 tablet Oral BID   Continuous Infusions: . 0.45 % NaCl with KCl 20 mEq / L Stopped (09/17/19 1204)  . methocarbamol (ROBAXIN) IV       LOS: 4 days    Time spent: 30 mins    Charise Killian, MD [facility] Pager [phone]  If 7PM-7AM, please contact night-coverage [email or web address] 09/19/2019, 7:24 AM

## 2019-09-20 DIAGNOSIS — I10 Essential (primary) hypertension: Secondary | ICD-10-CM

## 2019-09-20 LAB — GLUCOSE, CAPILLARY
Glucose-Capillary: 107 mg/dL — ABNORMAL HIGH (ref 70–99)
Glucose-Capillary: 124 mg/dL — ABNORMAL HIGH (ref 70–99)
Glucose-Capillary: 86 mg/dL (ref 70–99)
Glucose-Capillary: 92 mg/dL (ref 70–99)

## 2019-09-20 LAB — CBC
HCT: 32.4 % — ABNORMAL LOW (ref 36.0–46.0)
Hemoglobin: 10.9 g/dL — ABNORMAL LOW (ref 12.0–15.0)
MCH: 32.6 pg (ref 26.0–34.0)
MCHC: 33.6 g/dL (ref 30.0–36.0)
MCV: 97 fL (ref 80.0–100.0)
Platelets: 252 10*3/uL (ref 150–400)
RBC: 3.34 MIL/uL — ABNORMAL LOW (ref 3.87–5.11)
RDW: 12.3 % (ref 11.5–15.5)
WBC: 5.9 10*3/uL (ref 4.0–10.5)
nRBC: 0 % (ref 0.0–0.2)

## 2019-09-20 LAB — BASIC METABOLIC PANEL
Anion gap: 9 (ref 5–15)
BUN: <5 mg/dL — ABNORMAL LOW (ref 8–23)
CO2: 28 mmol/L (ref 22–32)
Calcium: 8.3 mg/dL — ABNORMAL LOW (ref 8.9–10.3)
Chloride: 101 mmol/L (ref 98–111)
Creatinine, Ser: 0.36 mg/dL — ABNORMAL LOW (ref 0.44–1.00)
GFR calc Af Amer: >60 mL/min (ref >60)
GFR calc non Af Amer: >60 mL/min (ref >60)
Glucose, Bld: 100 mg/dL — ABNORMAL HIGH (ref 70–99)
Potassium: 3.5 mmol/L (ref 3.5–5.1)
Sodium: 138 mmol/L (ref 135–145)

## 2019-09-20 MED ORDER — GABAPENTIN 100 MG PO CAPS: 100.0000 mg | ORAL_CAPSULE | Freq: Three times a day (TID) | ORAL | 0 refills | Status: AC

## 2019-09-20 MED ORDER — HYDROCODONE-ACETAMINOPHEN 5-325 MG PO TABS: 1.0000 | ORAL_TABLET | Freq: Four times a day (QID) | ORAL | 0 refills | Status: AC | PRN

## 2019-09-20 MED ORDER — ENOXAPARIN SODIUM 40 MG/0.4ML ~~LOC~~ SOLN: 40.0000 mg | SUBCUTANEOUS | 0 refills | Status: AC

## 2019-09-20 NOTE — TOC Transition Note (Signed)
Transition of Care [facility]) - CM/SW Discharge Note   Patient Details  Name: Annette Huff MRN: [id number] Date of Birth: August 10, 1960  [facility]) CM/SW Contact:  Maud Deed, LCSW Phone Number: 09/20/2019, 11:40 AM   Clinical Narrative:    Pt medically stable for discharge per MD. Pt will be transported home by her husband. [facility] to follow for PT and OT. Pt's DME has been delivered to her room.   Final next level of care: Home w Home Health Services Barriers to Discharge: No Barriers Identified   Patient Goals and CMS Choice Patient states their goals for this hospitalization and ongoing recovery are:: I hope to go home soon once I am able to move around [facility] Medicare.gov Compare Post Acute Care list provided to:: Patient Choice offered to / list presented to : Patient  Discharge Placement                Patient to be transferred to facility by: Spouse Name of family member notified: joseph Patient and family notified of of transfer: 09/20/19  Discharge Plan and Services In-house Referral: Clinical Social Work   Post Acute Care Choice: Home Health, Durable Medical Equipment          DME Arranged: Dan Humphreys rolling DME Agency: [employer] Date DME Agency Contacted: 09/17/19 Time DME Agency Contacted: 1113 Representative spoke with at DME Agency: Ian Malkin HH Arranged: PT, OT HH Agency: [facility] Date [facility] Agency Contacted: 09/20/19 Time HH Agency Contacted: 1140 Representative spoke with at [facility] Agency: Enrique Sack  Social Determinants of Health (SDOH) Interventions     Readmission Risk Interventions No flowsheet data found.

## 2019-09-20 NOTE — Progress Notes (Signed)
Subjective: 4 Days Post-Op Procedure(s) (LRB): INTRAMEDULLARY (IM) NAIL INTERTROCHANTRIC (Left)   Patient is out of bed in a chair and feels well. Fully oriented. Ready to go home today. She will follow up with Dr. Martha Clan in 2 weeks. She will be partial weightbearing on the leg.  Patient reports pain as mild.  Objective:   VITALS:   Vitals:   09/20/19 0024 09/20/19 0730  BP: (!) 106/59 135/74  Pulse: 72 73  Resp: 16 16  Temp: 98.7 F (37.1 C) 99.5 F (37.5 C)  SpO2: 93% 96%    Neurologically intact Neurovascular intact Sensation intact distally Intact pulses distally Dorsiflexion/Plantar flexion intact Incision: dressing C/D/I  LABS Recent Labs    09/18/19 0313 09/19/19 0355 09/20/19 0600  HGB 11.9* 10.9* 10.9*  HCT 34.6* 31.9* 32.4*  WBC 6.8 6.4 5.9  PLT 210 219 252    Recent Labs    09/18/19 0313 09/19/19 0355 09/20/19 0600  NA 134* 138 138  K 3.5 3.6 3.5  BUN <5* <5* <5*  CREATININE 0.54 0.38* 0.36*  GLUCOSE 106* 94 100*    No results for input(s): LABPT, INR in the last 72 hours.   Assessment/Plan: 4 Days Post-Op Procedure(s) (LRB): INTRAMEDULLARY (IM) NAIL INTERTROCHANTRIC (Left)   Up with therapy Discharge home with home health   Lovenox 40 mg daily for 2 weeks  Follow-up with Dr. Martha Clan in 2 weeks in our office.  Partial weightbearing on the leg.

## 2019-09-20 NOTE — Progress Notes (Signed)
Physical Therapy Treatment Patient Details Name: Annette Huff MRN: [id number] DOB: [birth year] Today's Date: 09/20/2019    History of Present Illness 59 year old female who presented to ED after fall, resulting in L femur fx.  Pt is s/p IM nailing on 09/16/19.  Pt's PMH includes CAD, L2 compression fx, DM2, mild cognitive impairment, hx of TIA, and hypertension.    PT Comments    Out of bed, to commode and completed lap around large unit with RW and supervision/mimn guard.  Gait steady but does endorse some general fatigue today.  Discussed home and safety with pt and husband.  No questions or concerns.  HR and O2 within limits today.     Follow Up Recommendations  Home health PT;Follow surgeon's recommendation for DC plan and follow-up therapies     Equipment Recommendations  Rolling walker with 5" wheels    Recommendations for Other Services       Precautions / Restrictions Precautions Precautions: Fall Restrictions Weight Bearing Restrictions: Yes LLE Weight Bearing: Weight bearing as tolerated    Mobility  Bed Mobility Overal bed mobility: Needs Assistance Bed Mobility: Supine to Sit     Supine to sit: Supervision     General bed mobility comments: slow with rails but no assist.  Transfers Overall transfer level: Needs assistance Equipment used: Rolling walker (2 wheeled) Transfers: Sit to/from Stand Sit to Stand: Supervision            Ambulation/Gait Ambulation/Gait assistance: Supervision Gait Distance (Feet): 270 Feet Assistive device: Rolling walker (2 wheeled) Gait Pattern/deviations: Step-to pattern;Step-through pattern;Decreased step length - right;Decreased stance time - left Gait velocity: decreased   General Gait Details: sats and HR within limits today.   Stairs         General stair comments: Did not practice stairs this session. Patient reports she feels comfortable with getting into her home safely after practicing twice  before.   Wheelchair Mobility    Modified Rankin (Stroke Patients Only)       Balance Overall balance assessment: Needs assistance   Sitting balance-Leahy Scale: Good Sitting balance - Comments: steady sitting at edge of chair.   Standing balance support: Bilateral upper extremity supported;During functional activity Standing balance-Leahy Scale: Good Standing balance comment: reliant on walker, hesitant with full L WBing but ultimately did not have overt balance/safety issues                            Cognition Arousal/Alertness: Awake/alert Behavior During Therapy: WFL for tasks assessed/performed Overall Cognitive Status: Within Functional Limits for tasks assessed                                        Exercises Other Exercises Other Exercises: to commode to void    General Comments        Pertinent Vitals/Pain Pain Assessment: Faces Faces Pain Scale: Hurts little more Pain Location: when initially weight bearing or flexing hip. more of a soreness Pain Descriptors / Indicators: Guarding;Discomfort;Grimacing Pain Intervention(s): Limited activity within patient's tolerance;Monitored during session;Repositioned    Home Living                      Prior Function            PT Goals (current goals can now be found in the care plan section) Progress  towards PT goals: Progressing toward goals    Frequency    BID      PT Plan Current plan remains appropriate    Co-evaluation              AM-PAC PT "6 Clicks" Mobility   Outcome Measure  Help needed turning from your back to your side while in a flat bed without using bedrails?: None Help needed moving from lying on your back to sitting on the side of a flat bed without using bedrails?: None Help needed moving to and from a bed to a chair (including a wheelchair)?: A Little Help needed standing up from a chair using your arms (e.g., wheelchair or bedside  chair)?: A Little Help needed to walk in hospital room?: A Little Help needed climbing 3-5 steps with a railing? : A Little 6 Click Score: 20    End of Session Equipment Utilized During Treatment: Gait belt Activity Tolerance: Patient tolerated treatment well Patient left: with call bell/phone within reach;with family/visitor present;in chair;with chair alarm set Nurse Communication: Mobility status       Time: 2297-9892 PT Time Calculation (min) (ACUTE ONLY): 23 min  Charges:  $Gait Training: 23-37 mins                    Danielle Dess, PTA 09/20/19, 11:42 AM

## 2019-09-20 NOTE — Progress Notes (Signed)
Patient is stable and ready for discharge. Patient's IV removed. Writer went over discharge paperwork with patient and husband and both verbalized understanding and all questions answered. Also writer went over how to administer Lovenox injections and patient was able to demonstrate process back without issues. Also patient's hard script prescription was placed in discharge packet. Patient was sent home with 3 in 1 and RW taken to car by husband. Patient's husband packed all patient's belongings and took to car. Patient transported via [facility] by NT to patient's private car with husband.

## 2019-09-20 NOTE — Discharge Summary (Signed)
Physician Discharge Summary  Annette Huff OJJ:[id number] DOB: 24-Apr-1960 DOA: 09/15/2019  PCP: Lauro Regulus, MD  Admit date: 09/15/2019 Discharge date: 09/20/2019  Admitted From: home Disposition:  Home w/ home health   Recommendations for Outpatient Follow-up:  1. Follow up with PCP in 1-2 weeks 2. F/u ortho surg, Dr. Martha Clan, in 2 weeks  Home Health: yes Equipment/Devices: 3n1  Discharge Condition: stable CODE STATUS: full  Diet recommendation: Heart Healthy / Carb Modified   Brief/Interim Summary: HPI was taken from Dr. Para March: Annette Huff is a 59 y.o. 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  Patient got up from watching TV and fell.  Her husband helped her to bed however she stayed in bed all day and when she attempted to move she could not bear weight on the left hip.  Patient cannot recall the circumstances surrounding the fall though she does remember her husband helping her out.  She cannot recall having preceding symptoms such as lightheadedness, visual disturbance, one-sided numbness or weakness, chest pain, palpitations or shortness of breath.  She has not been recently ill.  Following the fall she did appear more confused.  ED Course: On arrival, patient alert and oriented.  Vitals within normal limits.  Imaging work-up revealed intertrochanteric fracture of the left hip.  CT head negative.  Finding of age-indeterminate L2 compression fracture.  Blood work significant for hyponatremia of 126 but otherwise within normal limits.  ER provider spoke with orthopedist, Dr. Martha Clan who will take patient to the OR in the a.m.  Hospitalist consulted for admission.  Hospital Course from Dr. Wilfred Lacy 7/14-7/18/21: Pt presented after a fall at home and was found to  have left femur fracture. Pt is s/p intramedullary fixation on 09/16/19 as per ortho surgery. Pt tolerated the surgery fairly well. PT/OT saw the pt and recommended home health. Home health was set up prior to d/c by CM.  Of note, pt was found to have a likely post-op fever but no infection was found. The pt was afebrile 24 hours prior to d/c. For more information, please see other progress notes.   Discharge Diagnoses:  Principal Problem:   Closed fracture of femur, intertrochanteric, left, initial encounter [facility]) Active Problems:   Coronary artery disease involving native coronary artery of native heart without angina pectoris   Fall at home, initial encounter   Preoperative clearance   Compression fracture of L2 (HCC) - age indeterminate   Diabetes mellitus type 2 with complications (HCC)   MCI (mild cognitive impairment)   History of TIA (transient ischemic attack)   HTN (hypertension)   Hyponatremia  Left closed fracture of femur: secondary to fall at home. Slightly improved pain today. Continue w/ dilaudid & norco. CT head with no acute intracranial findings. S/p intramedullary fixation on 09/16/19. Ortho surg recs apprec. PT/OT recs home health.   Fever: etiology unclear. Normal WBC. Likely post-op. Will hold off on abx for now. Afebrile for 24 hours.   Hypoxia: occurred during surgery. Weaned off of supplemental oxygen. CXR was normal. Normal WBC. Resolved   Depression: severity unknown. Will continue lexapro   Hyponatremia: resolved  CAD: w/ hx of stent. Not on aspirin, statin or BB at home.   Compression fracture of L2: age indeterminate. Unlikely related to fall  DM2:  continue on SSI w/ accuchecks   Mild cognitive impairment: continue w/ neuro outpatient f/u   History of TIA: pt refused MRI on admission. Not on statin or aspirin   Discharge Instructions  Discharge Instructions    Diet - low sodium heart healthy   Complete by: As directed    Discharge  instructions   Complete by: As directed    F/u PCP in 1 week. F/u ortho surgery, Dr. Rico Ala, in 2 weeks   Increase activity slowly   Complete by: As directed    No wound care   Complete by: As directed      Allergies as of 09/20/2019      Reactions   Guaifenesin Other (See Comments)   Sulfa Antibiotics Other (See Comments)      Medication List    STOP taking these medications   acetaminophen 500 MG tablet Commonly known as: TYLENOL     TAKE these medications   enoxaparin 40 MG/0.4ML injection Commonly known as: LOVENOX Inject 0.4 mLs (40 mg total) into the skin daily for 14 days. Start taking on: September 21, 2019   escitalopram 10 MG tablet Commonly known as: LEXAPRO Take 10 mg by mouth daily at 6 (six) AM.   gabapentin 100 MG capsule Commonly known as: NEURONTIN Take 1 capsule (100 mg total) by mouth 3 (three) times daily.   HYDROcodone-acetaminophen 5-325 MG tablet Commonly known as: NORCO/VICODIN Take 1 tablet by mouth every 6 (six) hours as needed for up to 5 days for moderate pain or severe pain.            Durable Medical Equipment  (From admission, onward)         Start     Ordered   09/17/19 1356  For home use only DME 3 n 1  Once        09/17/19 1355          Follow-up Information    Juanell Fairly, MD. Schedule an appointment as soon as possible for a visit in 2 week(s).   Specialty: Orthopedic Surgery Why: Make appointment with Dr. Martha Clan prior to leaving the hospital if possible. Contact information: [address] [place] [address] [phone]              Allergies  Allergen Reactions  . Guaifenesin Other (See Comments)  . Sulfa Antibiotics Other (See Comments)    Consultations:  Ortho surg, Dr. Martha Clan    Procedures/Studies: DG Chest 1 View  Result Date: 09/15/2019 CLINICAL DATA:  Fall.  Left hip fracture. EXAM: CHEST  1 VIEW COMPARISON:  02/27/2013 FINDINGS: Lower cervical plate and screw fixator.  Bony demineralization. Apical lordotic projection. The lungs appear clear. No blunting of the costophrenic angles. Cardiac and mediastinal margins appear normal. IMPRESSION: 1. No active cardiopulmonary disease is radiographically apparent. 2. Bony demineralization. Electronically Signed   By: Gaylyn Rong M.D.   On: 09/15/2019 19:15   DG Lumbar Spine 2-3 Views  Result Date: 09/15/2019 CLINICAL DATA:  59 year old female with fall and back pain. EXAM: LUMBAR SPINE - 2-3 VIEW COMPARISON:  None. FINDINGS: Five lumbar type vertebra. There is mild compression fracture of the superior endplate of L2, age indeterminate. Correlation with clinical exam and point tenderness recommended. No other acute fracture identified. The bones are osteopenic. There is mild multilevel degenerative changes with lower lumbar facet arthropathy. A 3 mm radiopaque focus in the right upper quadrant may represent a renal calculus or stone in the gallbladder versus a vascular  calcification or liver granuloma. There is atherosclerotic calcification of the abdominal aorta. The soft tissues are grossly unremarkable. IMPRESSION: Mild compression fracture of the superior endplate of L2, age indeterminate. Correlation with clinical exam and point tenderness recommended. Electronically Signed   By: Elgie CollardArash  Radparvar M.D.   On: 09/15/2019 19:15   CT Head Wo Contrast  Result Date: 09/15/2019 CLINICAL DATA:  Larey SeatFell this morning, possible loss of consciousness, inability to ambulate EXAM: CT HEAD WITHOUT CONTRAST CT CERVICAL SPINE WITHOUT CONTRAST TECHNIQUE: Multidetector CT imaging of the head and cervical spine was performed following the standard protocol without intravenous contrast. Multiplanar CT image reconstructions of the cervical spine were also generated. COMPARISON:  09/20/2018, 09/02/2003 FINDINGS: CT HEAD FINDINGS Brain: No acute infarct or hemorrhage. Lateral ventricles and midline structures are unremarkable. No acute extra-axial  fluid collections. No mass effect. Vascular: No hyperdense vessel or unexpected calcification. Skull: Normal. Negative for fracture or focal lesion. Sinuses/Orbits: No acute finding. Other: None. CT CERVICAL SPINE FINDINGS Alignment: Stable straightening of the cervical spine, with mild kyphosis centered at C4. Otherwise alignment is anatomic. Skull base and vertebrae: No acute displaced fracture. Soft tissues and spinal canal: No prevertebral fluid or swelling. No visible canal hematoma. Disc levels: Previous C6/C7 ACDF. There is prominent spondylosis at C4-5 and C5-6 with symmetrical bilateral neural foraminal narrowing. At C3/C4 there is significant bilateral facet hypertrophy and mild bilateral uncovertebral hypertrophy, resulting in symmetrical bilateral neural foraminal encroachment. Upper chest: Airway is patent. Emphysematous changes are seen at the lung apices. Other: Reconstructed images demonstrate no additional findings. IMPRESSION: 1. No acute intracranial process. 2. No acute cervical spine fracture. 3. Previous C6/C7 ACDF. Prominent spondylosis at C4-5 and C5-6 with symmetrical bilateral neural foraminal narrowing. Electronically Signed   By: Sharlet SalinaMichael  Brown M.D.   On: 09/15/2019 18:57   CT Cervical Spine Wo Contrast  Result Date: 09/15/2019 CLINICAL DATA:  Larey SeatFell this morning, possible loss of consciousness, inability to ambulate EXAM: CT HEAD WITHOUT CONTRAST CT CERVICAL SPINE WITHOUT CONTRAST TECHNIQUE: Multidetector CT imaging of the head and cervical spine was performed following the standard protocol without intravenous contrast. Multiplanar CT image reconstructions of the cervical spine were also generated. COMPARISON:  09/20/2018, 09/02/2003 FINDINGS: CT HEAD FINDINGS Brain: No acute infarct or hemorrhage. Lateral ventricles and midline structures are unremarkable. No acute extra-axial fluid collections. No mass effect. Vascular: No hyperdense vessel or unexpected calcification. Skull:  Normal. Negative for fracture or focal lesion. Sinuses/Orbits: No acute finding. Other: None. CT CERVICAL SPINE FINDINGS Alignment: Stable straightening of the cervical spine, with mild kyphosis centered at C4. Otherwise alignment is anatomic. Skull base and vertebrae: No acute displaced fracture. Soft tissues and spinal canal: No prevertebral fluid or swelling. No visible canal hematoma. Disc levels: Previous C6/C7 ACDF. There is prominent spondylosis at C4-5 and C5-6 with symmetrical bilateral neural foraminal narrowing. At C3/C4 there is significant bilateral facet hypertrophy and mild bilateral uncovertebral hypertrophy, resulting in symmetrical bilateral neural foraminal encroachment. Upper chest: Airway is patent. Emphysematous changes are seen at the lung apices. Other: Reconstructed images demonstrate no additional findings. IMPRESSION: 1. No acute intracranial process. 2. No acute cervical spine fracture. 3. Previous C6/C7 ACDF. Prominent spondylosis at C4-5 and C5-6 with symmetrical bilateral neural foraminal narrowing. Electronically Signed   By: Sharlet SalinaMichael  Brown M.D.   On: 09/15/2019 18:57   DG HIP OPERATIVE UNILAT W OR W/O PELVIS LEFT  Result Date: 09/16/2019 CLINICAL DATA:  Left operative hip EXAM: OPERATIVE left HIP (WITH PELVIS IF PERFORMED)  VIEWS  TECHNIQUE: Fluoroscopic spot image(s) were submitted for interpretation post-operatively. COMPARISON:  None. FINDINGS: Four intraop views were submitted for review of IM nail fixation of left femur fracture. Fluoro time 1 minutes 51 seconds IMPRESSION: Status post ORIF of proximal left femur fracture Electronically Signed   By: Jonna Clark M.D.   On: 09/16/2019 17:20   DG Hip Unilat W or Wo Pelvis 2-3 Views Left  Result Date: 09/15/2019 CLINICAL DATA:  Fall, left hip pain. EXAM: DG HIP (WITH OR WITHOUT PELVIS) 2-3V LEFT COMPARISON:  None. FINDINGS: Acute intertrochanteric fracture of the left hip noted (extra-articular). There is clear bony  discontinuity extending through the lesser and greater trochanter along with a lucency extending between these discontinuities. Preserved articular spaces in the hips. Bony demineralization. No additional pelvic fractures. IMPRESSION: 1. Acute intertrochanteric fracture of the left hip. 2. Bony demineralization. Electronically Signed   By: Gaylyn Rong M.D.   On: 09/15/2019 19:16   DG FEMUR PORT MIN 2 VIEWS LEFT  Result Date: 09/16/2019 CLINICAL DATA:  Postop IM nail. EXAM: LEFT FEMUR PORTABLE 2 VIEWS COMPARISON:  Preoperative hip radiograph yesterday. FINDINGS: Intramedullary nail with distal locking and trans trochanteric screw fixation of intertrochanteric femur fracture. The fracture is in improved alignment compared to preoperative imaging. No new fracture or periprosthetic lucency. Recent postsurgical change includes air and edema in the soft tissues and lateral skin staples. IMPRESSION: ORIF of left intertrochanteric femur fracture. No immediate postoperative complication. Electronically Signed   By: Narda Rutherford M.D.   On: 09/16/2019 18:25   (Echo, Carotid, EGD, Colonoscopy, ERCP)    Subjective: Pt denies any complaints    Discharge Exam: Vitals:   09/20/19 0024 09/20/19 0730  BP: (!) 106/59 135/74  Pulse: 72 73  Resp: 16 16  Temp: 98.7 F (37.1 C) 99.5 F (37.5 C)  SpO2: 93% 96%   Vitals:   09/19/19 0735 09/19/19 1619 09/20/19 0024 09/20/19 0730  BP: 133/90 106/67 (!) 106/59 135/74  Pulse: 86 76 72 73  Resp: 16 16 16 16   Temp: 99.1 F (37.3 C) 98.2 F (36.8 C) 98.7 F (37.1 C) 99.5 F (37.5 C)  TempSrc: Oral Oral Oral Oral  SpO2: 93% 97% 93% 96%  Weight:      Height:        General: Pt is alert, awake, not in acute distress Cardiovascular: S1/S2 +, no rubs, no gallops Respiratory: CTA bilaterally, no wheezing, no rhonchi Abdominal: Soft, NT, ND, bowel sounds + Extremities: no edema, no cyanosis    The results of significant diagnostics from this  hospitalization (including imaging, microbiology, ancillary and laboratory) are listed below for reference.     Microbiology: Recent Results (from the past 240 hour(s))  SARS Coronavirus 2 by RT PCR (hospital order, performed in [facility] hospital lab) Nasopharyngeal Nasopharyngeal Swab     Status: None   Collection Time: 09/15/19  8:08 PM   Specimen: Nasopharyngeal Swab  Result Value Ref Range Status   SARS Coronavirus 2 NEGATIVE NEGATIVE Final    Comment: (NOTE) SARS-CoV-2 target nucleic acids are NOT DETECTED.  The SARS-CoV-2 RNA is generally detectable in upper and lower respiratory specimens during the acute phase of infection. The lowest concentration of SARS-CoV-2 viral copies this assay can detect is 250 copies / mL. A negative result does not preclude SARS-CoV-2 infection and should not be used as the sole basis for treatment or other patient management decisions.  A negative result may occur with improper specimen collection / handling, submission of  specimen other than nasopharyngeal swab, presence of viral mutation(s) within the areas targeted by this assay, and inadequate number of viral copies (<250 copies / mL). A negative result must be combined with clinical observations, patient history, and epidemiological information.  Fact Sheet for Patients:   BoilerBrush.com.cy  Fact Sheet for Healthcare Providers: https://pope.com/  This test is not yet approved or  cleared by the [place] FDA and has been authorized for detection and/or diagnosis of SARS-CoV-2 by FDA under an Emergency Use Authorization (EUA).  This EUA will remain in effect (meaning this test can be used) for the duration of the COVID-19 declaration under Section 564(b)(1) of the Act, 21 U.S.C. section 360bbb-3(b)(1), unless the authorization is terminated or revoked sooner.  Performed at [facility], [address], [place], [place]  [address]   Surgical pcr screen     Status: None   Collection Time: 09/15/19 10:42 PM   Specimen: Nasal Mucosa; Nasal Swab  Result Value Ref Range Status   MRSA, PCR NEGATIVE NEGATIVE Final   Staphylococcus aureus NEGATIVE NEGATIVE Final    Comment: (NOTE) The Xpert SA Assay (FDA approved for NASAL specimens in patients 74 years of age and older), is one component of a comprehensive surveillance program. It is not intended to diagnose infection nor to guide or monitor treatment. Performed at [facility], [address] [place], [place] [address]      Labs: BNP (last 3 results) No results for input(s): BNP in the last 8760 hours. Basic Metabolic Panel: Recent Labs  Lab 09/16/19 0817 09/16/19 0817 09/16/19 1940 09/17/19 0316 09/18/19 0313 09/19/19 0355 09/20/19 0600  NA 136   < > 136 136 134* 138 138  K 3.5  --   --  3.7 3.5 3.6 3.5  CL 99  --   --  104 103 100 101  CO2 26  --   --  GLUCOSE 99  --   --  95 106* 94 100*  BUN <5*  --   --  <5* <5* <5* <5*  CREATININE 0.57  --   --  0.43* 0.54 0.38* 0.36*  CALCIUM 8.4*  --   --  7.8* 7.9* 8.3* 8.3*   < > = values in this interval not displayed.   Liver Function Tests: Recent Labs  Lab 09/15/19 1935  AST 22  ALT 17  ALKPHOS 65  BILITOT 0.5  PROT 6.6  ALBUMIN 3.5   No results for input(s): LIPASE, AMYLASE in the last 168 hours. No results for input(s): AMMONIA in the last 168 hours. CBC: Recent Labs  Lab 09/15/19 1935 09/15/19 1935 09/16/19 0011 09/17/19 0316 09/18/19 0313 09/19/19 0355 09/20/19 0600  WBC 9.7   < > 7.9 5.9 6.8 6.4 5.9  NEUTROABS 8.1*  --   --   --   --   --   --   HGB 13.3   < > 14.2 11.8* 11.9* 10.9* 10.9*  HCT 37.1   < > 38.6 35.5* 34.6* 31.9* 32.4*  MCV 92.3   < > 91.3 98.9 95.8 97.3 97.0  PLT 224   < > 243 188 210 219 252   < > = values in this interval not displayed.   Cardiac Enzymes: No results for input(s): CKTOTAL, CKMB, CKMBINDEX, TROPONINI in the last  168 hours. BNP: Invalid input(s): POCBNP CBG: Recent Labs  Lab 09/19/19 1621 09/19/19 2038 09/20/19 0027 09/20/19 0430 09/20/19 0733  GLUCAP 101* 99 107*  92 86   D-Dimer No results for input(s): DDIMER in the last 72 hours. Hgb A1c No results for input(s): HGBA1C in the last 72 hours. Lipid Profile No results for input(s): CHOL, HDL, LDLCALC, TRIG, CHOLHDL, LDLDIRECT in the last 72 hours. Thyroid function studies No results for input(s): TSH, T4TOTAL, T3FREE, THYROIDAB in the last 72 hours.  Invalid input(s): FREET3 Anemia work up No results for input(s): VITAMINB12, FOLATE, FERRITIN, TIBC, IRON, RETICCTPCT in the last 72 hours. Urinalysis    Component Value Date/Time   COLORURINE YELLOW (A) 09/15/2019 2008   APPEARANCEUR CLEAR (A) 09/15/2019 2008   LABSPEC 1.005 09/15/2019 2008   PHURINE 5.0 09/15/2019 2008   GLUCOSEU NEGATIVE 09/15/2019 2008   HGBUR SMALL (A) 09/15/2019 2008   BILIRUBINUR NEGATIVE 09/15/2019 2008   KETONESUR 5 (A) 09/15/2019 2008   PROTEINUR NEGATIVE 09/15/2019 2008   NITRITE NEGATIVE 09/15/2019 2008   LEUKOCYTESUR NEGATIVE 09/15/2019 2008   Sepsis Labs Invalid input(s): PROCALCITONIN,  WBC,  LACTICIDVEN Microbiology Recent Results (from the past 240 hour(s))  SARS Coronavirus 2 by RT PCR (hospital order, performed in [facility] hospital lab) Nasopharyngeal Nasopharyngeal Swab     Status: None   Collection Time: 09/15/19  8:08 PM   Specimen: Nasopharyngeal Swab  Result Value Ref Range Status   SARS Coronavirus 2 NEGATIVE NEGATIVE Final    Comment: (NOTE) SARS-CoV-2 target nucleic acids are NOT DETECTED.  The SARS-CoV-2 RNA is generally detectable in upper and lower respiratory specimens during the acute phase of infection. The lowest concentration of SARS-CoV-2 viral copies this assay can detect is 250 copies / mL. A negative result does not preclude SARS-CoV-2 infection and should not be used as the sole basis for treatment or  other patient management decisions.  A negative result may occur with improper specimen collection / handling, submission of specimen other than nasopharyngeal swab, presence of viral mutation(s) within the areas targeted by this assay, and inadequate number of viral copies (<250 copies / mL). A negative result must be combined with clinical observations, patient history, and epidemiological information.  Fact Sheet for Patients:   BoilerBrush.com.cy  Fact Sheet for Healthcare Providers: https://pope.com/  This test is not yet approved or  cleared by the [place] FDA and has been authorized for detection and/or diagnosis of SARS-CoV-2 by FDA under an Emergency Use Authorization (EUA).  This EUA will remain in effect (meaning this test can be used) for the duration of the COVID-19 declaration under Section 564(b)(1) of the Act, 21 U.S.C. section 360bbb-3(b)(1), unless the authorization is terminated or revoked sooner.  Performed at [facility], [address], [place], [place] [address]   Surgical pcr screen     Status: None   Collection Time: 09/15/19 10:42 PM   Specimen: Nasal Mucosa; Nasal Swab  Result Value Ref Range Status   MRSA, PCR NEGATIVE NEGATIVE Final   Staphylococcus aureus NEGATIVE NEGATIVE Final    Comment: (NOTE) The Xpert SA Assay (FDA approved for NASAL specimens in patients 40 years of age and older), is one component of a comprehensive surveillance program. It is not intended to diagnose infection nor to guide or monitor treatment. Performed at [facility], [address] [place], [place] [address]      Time coordinating discharge: Over 30 minutes  SIGNED:   Charise Killian, MD  [facility] 09/20/2019, 11:20 AM Pager   If 7PM-7AM, please contact night-coverage [email or web address]

## 2019-09-21 ENCOUNTER — Telehealth: Payer: Self-pay

## 2019-09-21 NOTE — Telephone Encounter (Signed)
PC to pt.  Spoke with pt's husband, as he explained pt. Was asleep.  Husband stated they were made aware of the water contamination issue in [place].  Reassured husband that pt. Is at low risk for infection, since the water tested at [facility] was not found to be contaminated.  Advised if the pt. Developed any diarrhea, stomach cramps, or nausea/ vomiting, to call her PCP.  Husband verb. Understanding, and voiced appreciation for the phone call.

## 2019-09-28 NOTE — Anesthesia Postprocedure Evaluation (Signed)
Anesthesia Post Note  Patient: Annette Huff  Procedure(s) Performed: INTRAMEDULLARY (IM) NAIL INTERTROCHANTRIC (Left )  Patient location during evaluation: PACU Anesthesia Type: Combined General/Spinal Level of consciousness: awake and alert and oriented Pain management: pain level controlled Vital Signs Assessment: post-procedure vital signs reviewed and stable Respiratory status: spontaneous breathing Cardiovascular status: blood pressure returned to baseline Anesthetic complications: no   No complications documented.   Last Vitals:  Vitals:   09/20/19 0024 09/20/19 0730  BP: (!) 106/59 135/74  Pulse: 72 73  Resp: 16 16  Temp: 37.1 C 37.5 C  SpO2: 93% 96%    Last Pain:  Vitals:   09/20/19 0911  TempSrc:   PainSc: 5                  Micahel Omlor

## 2020-01-04 DEATH — deceased

## 2021-11-30 IMAGING — DX DG FEMUR 2+V PORT*L*
4 series · 4 of 4 positions shown · non-contrast
Comparison: Preoperative hip radiograph yesterday.

CLINICAL DATA: Postop IM nail.

EXAM:
LEFT FEMUR PORTABLE 2 VIEWS

[femur ap (1 of 2)]
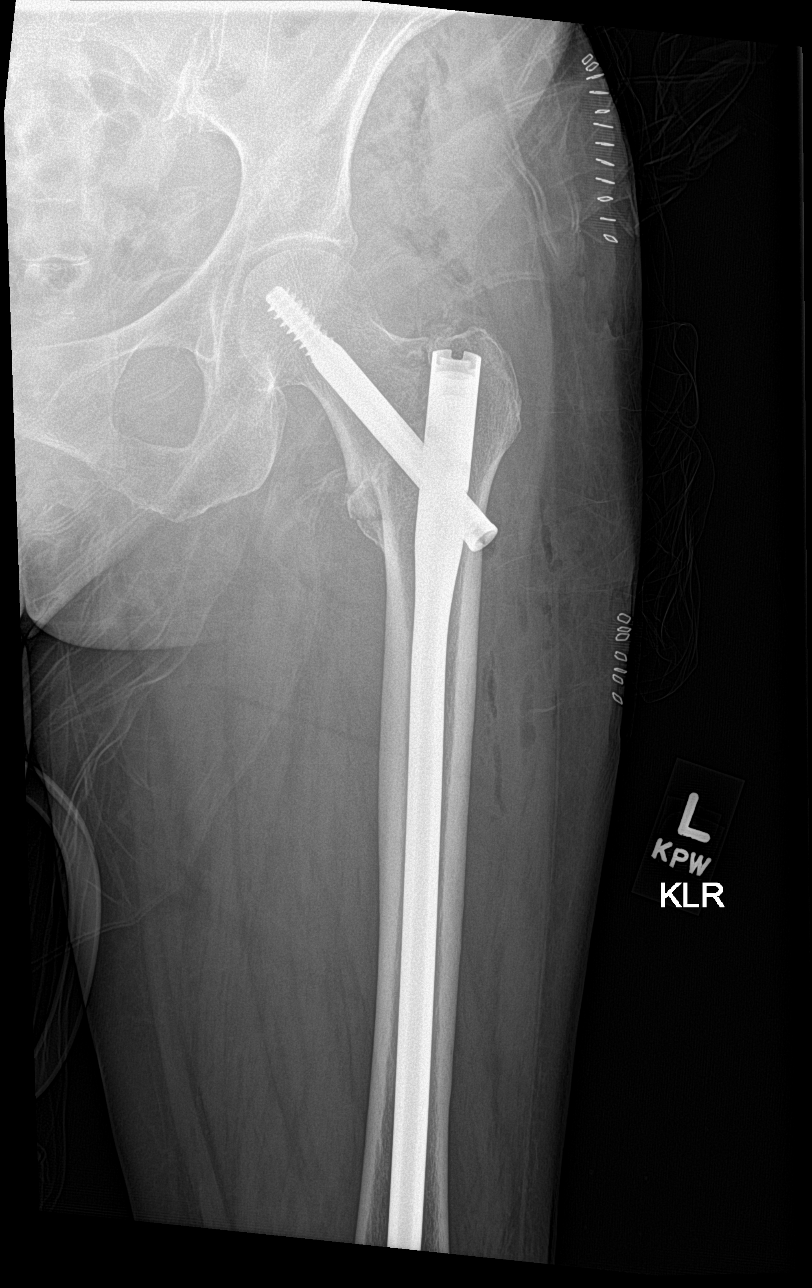

[femur ap (2 of 2)]
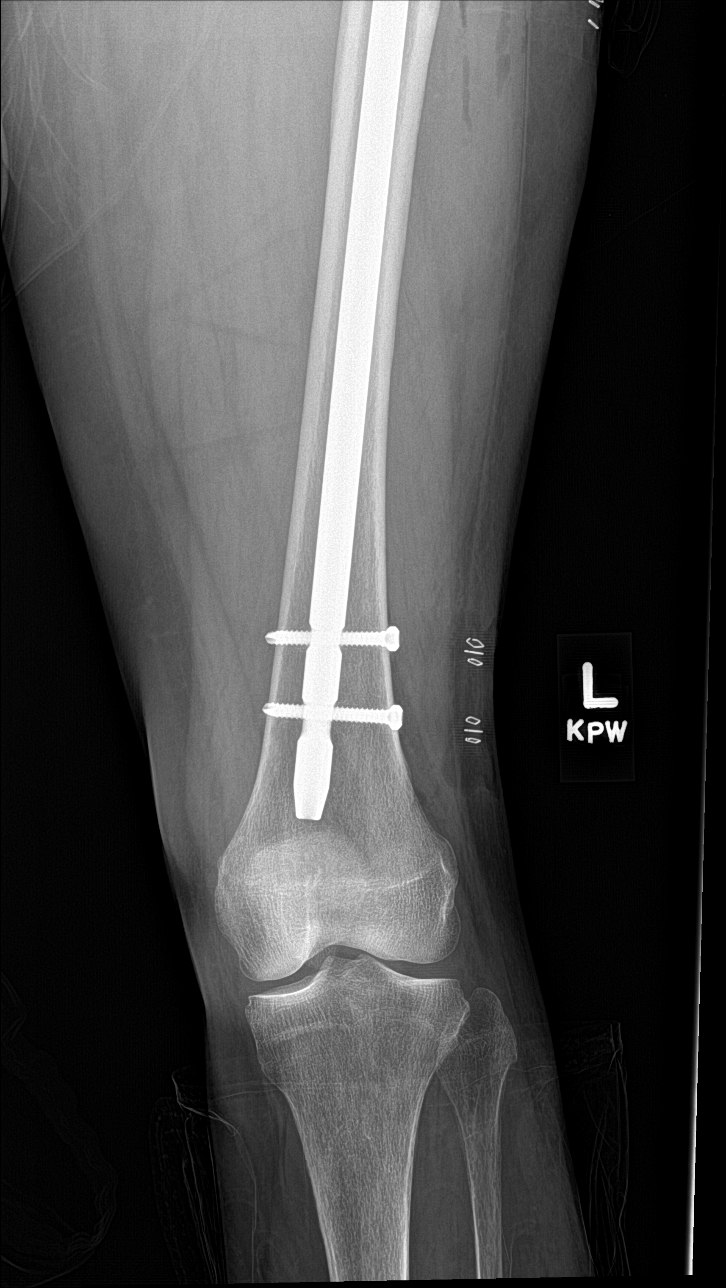

[femur lat (1 of 2)]
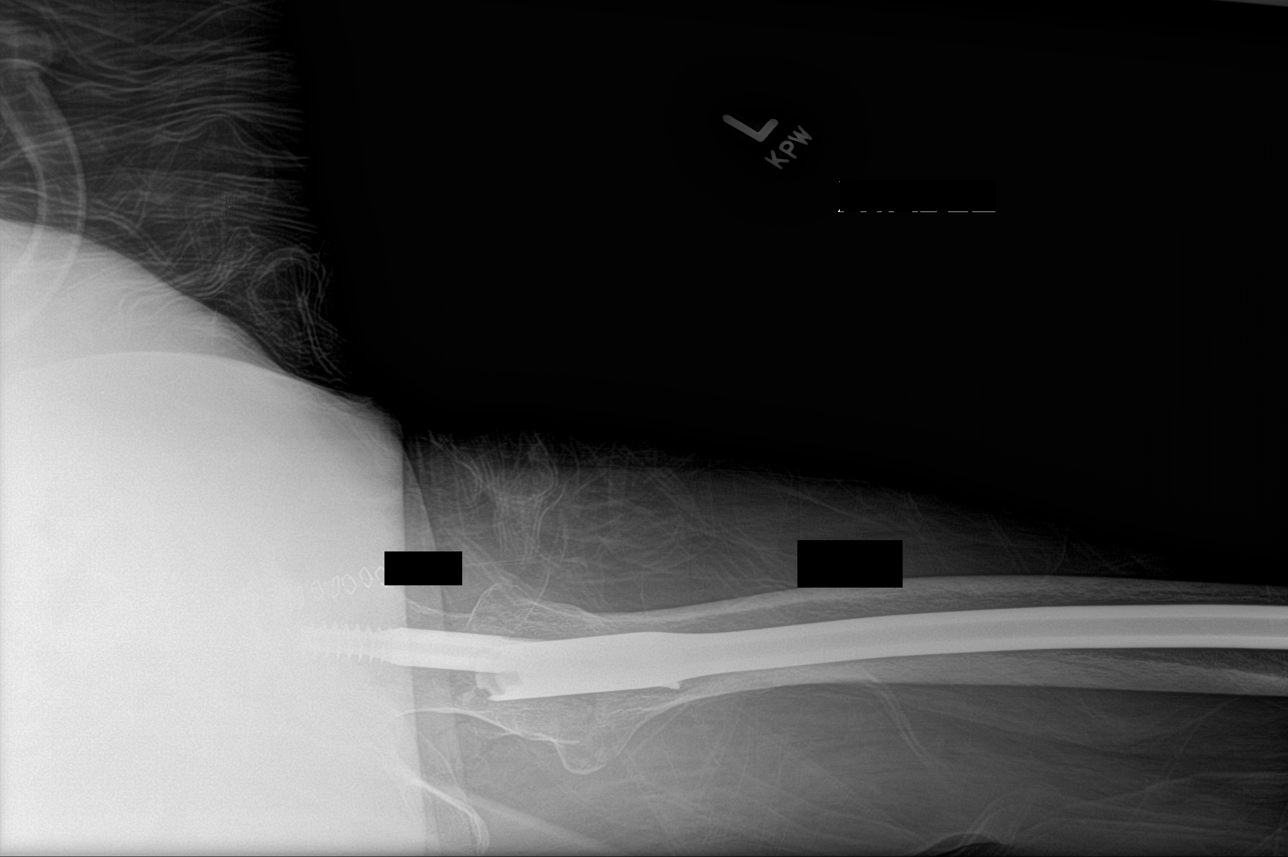

[femur lat (2 of 2)]
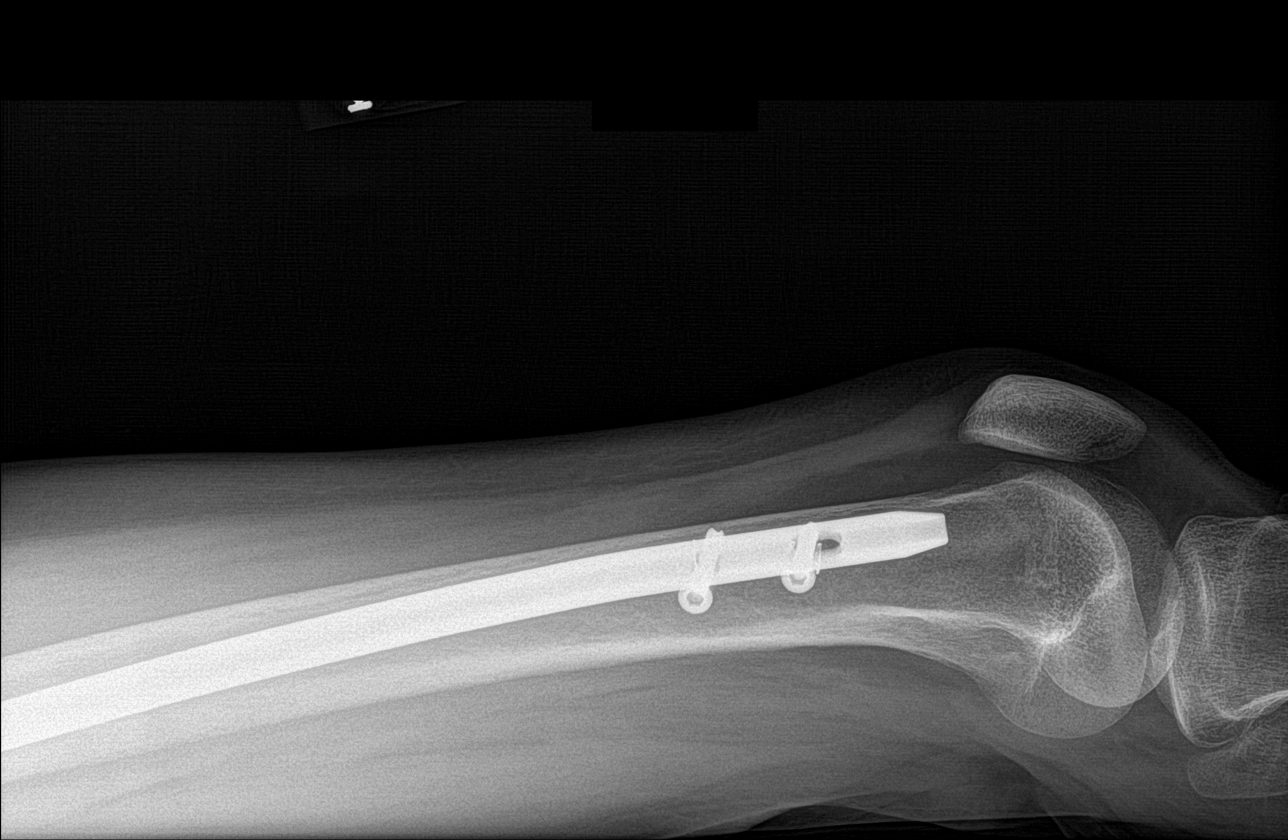

[4 of 4 positions shown; findings below may reference images not displayed]

FINDINGS: Intramedullary nail with distal locking and trans trochanteric screw
fixation of intertrochanteric femur fracture. The fracture is in
improved alignment compared to preoperative imaging. No new fracture
or periprosthetic lucency. Recent postsurgical change includes air
and edema in the soft tissues and lateral skin staples.
IMPRESSION: ORIF of left intertrochanteric femur fracture. No immediate
postoperative complication.
